# Patient Record
Sex: Male | Born: 1955 | Hispanic: No | Marital: Married | Smoking: Never smoker
Health system: Southern US, Community
[De-identification: ages and names within clinical notes are randomized; demographics above are authoritative.]

## PROBLEM LIST (undated history)

## (undated) DIAGNOSIS — K219 Gastro-esophageal reflux disease without esophagitis: Secondary | ICD-10-CM

## (undated) DIAGNOSIS — E785 Hyperlipidemia, unspecified: Secondary | ICD-10-CM

## (undated) DIAGNOSIS — I10 Essential (primary) hypertension: Secondary | ICD-10-CM

## (undated) DIAGNOSIS — C61 Malignant neoplasm of prostate: Secondary | ICD-10-CM

## (undated) DIAGNOSIS — Z8619 Personal history of other infectious and parasitic diseases: Secondary | ICD-10-CM

## (undated) HISTORY — DX: Essential (primary) hypertension: I10

## (undated) HISTORY — PX: EXTERNAL EAR SURGERY: SHX627

## (undated) HISTORY — DX: Hyperlipidemia, unspecified: E78.5

## (undated) HISTORY — PX: PROSTATE SURGERY: SHX751

## (undated) HISTORY — DX: Gastro-esophageal reflux disease without esophagitis: K21.9

## (undated) HISTORY — DX: Personal history of other infectious and parasitic diseases: Z86.19

---

## 2001-09-19 ENCOUNTER — Other Ambulatory Visit: Admission: RE | Admit: 2001-09-19 | Discharge: 2001-09-19 | Payer: Self-pay | Admitting: Otolaryngology

## 2002-01-09 ENCOUNTER — Encounter: Admission: RE | Admit: 2002-01-09 | Discharge: 2002-01-09 | Payer: Self-pay | Admitting: *Deleted

## 2002-01-09 ENCOUNTER — Encounter: Payer: Self-pay | Admitting: *Deleted

## 2007-10-22 LAB — HM COLONOSCOPY

## 2017-01-02 ENCOUNTER — Ambulatory Visit: Payer: 59

## 2017-01-02 ENCOUNTER — Ambulatory Visit (INDEPENDENT_AMBULATORY_CARE_PROVIDER_SITE_OTHER): Payer: 59

## 2017-01-02 ENCOUNTER — Ambulatory Visit (INDEPENDENT_AMBULATORY_CARE_PROVIDER_SITE_OTHER): Payer: 59 | Admitting: Podiatry

## 2017-01-02 ENCOUNTER — Encounter: Payer: Self-pay | Admitting: Podiatry

## 2017-01-02 VITALS — Resp 16 | Ht 69.5 in | Wt 240.0 lb

## 2017-01-02 DIAGNOSIS — M779 Enthesopathy, unspecified: Secondary | ICD-10-CM | POA: Diagnosis not present

## 2017-01-02 DIAGNOSIS — M79672 Pain in left foot: Secondary | ICD-10-CM

## 2017-01-02 DIAGNOSIS — L84 Corns and callosities: Secondary | ICD-10-CM

## 2017-01-02 DIAGNOSIS — M1000 Idiopathic gout, unspecified site: Secondary | ICD-10-CM

## 2017-01-02 DIAGNOSIS — M79673 Pain in unspecified foot: Secondary | ICD-10-CM

## 2017-01-02 DIAGNOSIS — M7751 Other enthesopathy of right foot: Secondary | ICD-10-CM | POA: Diagnosis not present

## 2017-01-02 DIAGNOSIS — M79674 Pain in right toe(s): Secondary | ICD-10-CM

## 2017-01-02 DIAGNOSIS — M778 Other enthesopathies, not elsewhere classified: Secondary | ICD-10-CM

## 2017-01-02 MED ORDER — TRIAMCINOLONE ACETONIDE 10 MG/ML IJ SUSP
10.0000 mg | Freq: Once | INTRAMUSCULAR | Status: AC
  Administered 2017-01-02: 10 mg

## 2017-01-02 NOTE — Patient Instructions (Signed)

## 2017-01-02 NOTE — Progress Notes (Signed)
cc

## 2017-01-02 NOTE — Progress Notes (Signed)
   Subjective:    Patient ID: TAJI HRNCIR, male    DOB: [birth year], 61 y.o.   MRN: [id number]  HPI  Chief Complaint  Patient presents with  . Skin Lesion    Left foot; plantar forefoot, beneath 5th toe x 3 months. Pt states that it is painful.  . Foot Pain    Left foot; lateral side, Pt states that he has a "knot in the area for a couple of years, pain worsens when he wears steel toe boots and on his feet". Also has a small knot on lateral side of right foot in the same area that is not painful.   . Toe Pain    Right foot, great toe x 2 weeks. Pt states that it comes and goes and he is concerned it is gout.        Review of Systems     Objective:   Physical Exam        Assessment & Plan:

## 2017-01-04 NOTE — Progress Notes (Signed)
Subjective:     Patient ID: Jason Bartlett, male   DOB: [birth year], 61 y.o.   MRN: [id number]  HPI patient states she's developed a lot of pain under the left foot with the metatarsal being quite inflamed and making it hard to walk. States that he's tried different options without relief of symptoms and that it's gradually becoming more intense   Review of Systems  All other systems reviewed and are negative.      Objective:   Physical Exam  Constitutional: He is oriented to person, place, and time.  Cardiovascular: Intact distal pulses.   Musculoskeletal: Normal range of motion.  Neurological: He is oriented to person, place, and time.  Skin: Skin is warm.  Nursing note and vitals reviewed.  neurovascular status intact muscle strength adequate range of motion within normal limits with patient found to have intense discomfort underneath the fifth metatarsal left with fluid buildup keratotic tissue formation and pain when palpated. Patient's noted to have good digital perfusion and is well oriented 3 Inflammatory    Assessment:      capsulitis fifth MPJ left with no indications of fracture with lesion formation with lucent core    Plan:     H&P condition reviewed with patient and explained. Today I did a careful capsular injection 3 mg Dexon some Kenalog 5 mg Xylocaine and debrided the lesion fully and advised on reduced activity. Reappoint when symptomatic   X-ray report was negative for signs of fracture or bone pathology

## 2017-10-28 DIAGNOSIS — Z23 Encounter for immunization: Secondary | ICD-10-CM | POA: Diagnosis not present

## 2018-02-08 ENCOUNTER — Ambulatory Visit (INDEPENDENT_AMBULATORY_CARE_PROVIDER_SITE_OTHER): Payer: 59 | Admitting: Family Medicine

## 2018-02-08 ENCOUNTER — Encounter: Payer: Self-pay | Admitting: Family Medicine

## 2018-02-08 ENCOUNTER — Other Ambulatory Visit: Payer: Self-pay

## 2018-02-08 VITALS — BP 150/88 | HR 80 | Temp 98.1°F | Resp 16 | Ht 70.0 in | Wt 259.2 lb

## 2018-02-08 DIAGNOSIS — Z1211 Encounter for screening for malignant neoplasm of colon: Secondary | ICD-10-CM | POA: Diagnosis not present

## 2018-02-08 DIAGNOSIS — N529 Male erectile dysfunction, unspecified: Secondary | ICD-10-CM

## 2018-02-08 DIAGNOSIS — Z23 Encounter for immunization: Secondary | ICD-10-CM | POA: Diagnosis not present

## 2018-02-08 DIAGNOSIS — K429 Umbilical hernia without obstruction or gangrene: Secondary | ICD-10-CM | POA: Diagnosis not present

## 2018-02-08 DIAGNOSIS — E785 Hyperlipidemia, unspecified: Secondary | ICD-10-CM | POA: Insufficient documentation

## 2018-02-08 DIAGNOSIS — I10 Essential (primary) hypertension: Secondary | ICD-10-CM

## 2018-02-08 MED ORDER — HYDROCHLOROTHIAZIDE 12.5 MG PO TABS
12.5000 mg | ORAL_TABLET | Freq: Every day | ORAL | 3 refills | Status: DC
Start: 2018-02-08 — End: 2018-03-28

## 2018-02-08 NOTE — Assessment & Plan Note (Signed)
Pt has hx of HTN and was previously on medication.  BP is again elevated today so will start HCTZ daily (has previously been on Benicar HCT 40/12.5mg  w/o difficulty despite sulfa allergy).  He is currently asymptomatic.  Check labs.  Will follow closely.

## 2018-02-08 NOTE — Assessment & Plan Note (Signed)
New to provider but pt has hx of this.  Previously on Pravastatin.  He has not had lipids checked in quite awhile.  Check labs.  Start meds prn

## 2018-02-08 NOTE — Assessment & Plan Note (Signed)
New to provider, ongoing for pt.  Hernia is large but not painful.  Will refer to Dr Emogene Morgan for evaluation and tx.  Pt expressed understanding and is in agreement w/ plan.

## 2018-02-08 NOTE — Assessment & Plan Note (Signed)
Pt's BMI is 37.2 but given his HTN and hyperlipidemia, he qualifies as morbidly obese.  Check labs to risk stratify.  Encouraged healthy diet and regular exercise.  Will follow.

## 2018-02-08 NOTE — Patient Instructions (Signed)
Follow up in 1 month to recheck BP We'll notify you of your lab results and make any changes if needed We will refer you to both General Surgery and [facility] Continue to work on healthy diet and regular exercise- you can do it!!! START the Hydrochlorothiazide daily to improve your blood pressure Limit your salt intake- this will also improve blood presure Call with any questions or concerns Welcome!  We're glad to have you!!!

## 2018-02-08 NOTE — Assessment & Plan Note (Signed)
New.  Pt is not interested in medication at this time.  Would like to attempt BP control and weight loss first.  Will revisit as needed.

## 2018-02-08 NOTE — Progress Notes (Signed)
   Subjective:    Patient ID: Jason Bartlett, male    DOB: 05-11-1956, 62 y.o.   MRN: [id number]  HPI New to establish.  Pt has not been seen in 10-12 yrs.  Due for repeat colonoscopy- last done 2018.  Hyperlipidemia- pt was previously on Pravastatin but not on anything currently.  No abd pain, N/V.  Elevated BP- pt reports he has a hx of elevated BP but was able to stop medication a few years ago.  No CP, SOB, HAs, visual changes, edema.  Umbilical hernia- not painful but enlarging.  Pt is very self conscious about this.  Obesity- pt's BMI is 37.2  No regular exercise.  Has an active job- 15,000 steps daily.  ED- still able to have an erection, 'it just doesn't last as long'.  Recall colonoscopy   Review of Systems For ROS see HPI     Objective:   Physical Exam  Constitutional: He is oriented to person, place, and time. He appears well-developed and well-nourished. No distress.  obese  HENT:  Head: Normocephalic and atraumatic.  Eyes: Conjunctivae and EOM are normal. Pupils are equal, round, and reactive to light.  Neck: Normal range of motion. Neck supple. No thyromegaly present.  Cardiovascular: Normal rate, regular rhythm, normal heart sounds and intact distal pulses.  No murmur heard. Pulmonary/Chest: Effort normal and breath sounds normal. No respiratory distress.  Abdominal: Soft. Bowel sounds are normal. He exhibits mass (large umbilical hernia). He exhibits no distension. There is no tenderness.  Musculoskeletal: He exhibits no edema.  Lymphadenopathy:    He has no cervical adenopathy.  Neurological: He is alert and oriented to person, place, and time. No cranial nerve deficit.  Skin: Skin is warm and dry.  Psychiatric: He has a normal mood and affect. His behavior is normal.  Vitals reviewed.         Assessment & Plan:

## 2018-02-09 LAB — HEPATIC FUNCTION PANEL
AG Ratio: 1.4 (calc) (ref 1.0–2.5)
ALT: 37 U/L (ref 9–46)
AST: 35 U/L (ref 10–35)
Albumin: 4.3 g/dL (ref 3.6–5.1)
Alkaline phosphatase (APISO): 49 U/L (ref 40–115)
Bilirubin, Direct: 0.1 mg/dL (ref 0.0–0.2)
Globulin: 3 g/dL (calc) (ref 1.9–3.7)
Indirect Bilirubin: 0.7 mg/dL (calc) (ref 0.2–1.2)
Total Bilirubin: 0.8 mg/dL (ref 0.2–1.2)
Total Protein: 7.3 g/dL (ref 6.1–8.1)

## 2018-02-09 LAB — BASIC METABOLIC PANEL
BUN: 16 mg/dL (ref 7–25)
CO2: 27 mmol/L (ref 20–32)
Calcium: 9.7 mg/dL (ref 8.6–10.3)
Chloride: 103 mmol/L (ref 98–110)
Creat: 1.1 mg/dL (ref 0.70–1.25)
Glucose, Bld: 103 mg/dL — ABNORMAL HIGH (ref 65–99)
Potassium: 4.7 mmol/L (ref 3.5–5.3)
Sodium: 142 mmol/L (ref 135–146)

## 2018-02-09 LAB — LIPID PANEL
Cholesterol: 176 mg/dL (ref <200)
HDL: 32 mg/dL — ABNORMAL LOW (ref >40)
LDL Cholesterol (Calc): 108 mg/dL (calc) — ABNORMAL HIGH
Non-HDL Cholesterol (Calc): 144 mg/dL (calc) — ABNORMAL HIGH (ref <130)
Total CHOL/HDL Ratio: 5.5 (calc) — ABNORMAL HIGH (ref <5.0)
Triglycerides: 237 mg/dL — ABNORMAL HIGH (ref <150)

## 2018-02-09 LAB — CBC WITH DIFFERENTIAL/PLATELET
Basophils Absolute: 122 cells/uL (ref 0–200)
Basophils Relative: 1.5 %
Eosinophils Absolute: 381 cells/uL (ref 15–500)
Eosinophils Relative: 4.7 %
HCT: 41.4 % (ref 38.5–50.0)
Hemoglobin: 14.6 g/dL (ref 13.2–17.1)
Lymphs Abs: 1790 cells/uL (ref 850–3900)
MCH: 30.4 pg (ref 27.0–33.0)
MCHC: 35.3 g/dL (ref 32.0–36.0)
MCV: 86.3 fL (ref 80.0–100.0)
MPV: 11.1 fL (ref 7.5–12.5)
Monocytes Relative: 9.4 %
Neutro Abs: 5046 cells/uL (ref 1500–7800)
Neutrophils Relative %: 62.3 %
Platelets: 157 10*3/uL (ref 140–400)
RBC: 4.8 10*6/uL (ref 4.20–5.80)
RDW: 12.9 % (ref 11.0–15.0)
Total Lymphocyte: 22.1 %
WBC mixed population: 761 cells/uL (ref 200–950)
WBC: 8.1 10*3/uL (ref 3.8–10.8)

## 2018-02-09 LAB — PSA: PSA: 2.9 ng/mL (ref <=4.0)

## 2018-02-09 LAB — TSH: TSH: 2.02 mIU/L (ref 0.40–4.50)

## 2018-02-28 DIAGNOSIS — K420 Umbilical hernia with obstruction, without gangrene: Secondary | ICD-10-CM | POA: Diagnosis not present

## 2018-03-07 ENCOUNTER — Other Ambulatory Visit: Payer: Self-pay

## 2018-03-07 ENCOUNTER — Ambulatory Visit: Payer: 59 | Admitting: Family Medicine

## 2018-03-07 ENCOUNTER — Encounter: Payer: Self-pay | Admitting: Family Medicine

## 2018-03-07 VITALS — BP 162/89 | HR 98 | Temp 98.1°F | Resp 16 | Ht 71.0 in | Wt 263.5 lb

## 2018-03-07 DIAGNOSIS — I10 Essential (primary) hypertension: Secondary | ICD-10-CM | POA: Diagnosis not present

## 2018-03-07 MED ORDER — LISINOPRIL-HYDROCHLOROTHIAZIDE 20-12.5 MG PO TABS: 1.0000 | ORAL_TABLET | Freq: Every day | ORAL | 3 refills | Status: DC

## 2018-03-07 NOTE — Progress Notes (Signed)
   Subjective:    Patient ID: Jason Bartlett, male    DOB: 06-Jul-1956, 62 y.o.   MRN: [id number]  HPI HTN- pt was started on HCTZ last visit.  BP today is 20 points higher despite medication.  Pt reports home BPs run 140s-150s.  No CP, SOB, HAs, visual changes, edema.  Morbid Obesity- pt has gained another 5 lbs.   Review of Systems For ROS see HPI     Objective:   Physical Exam  Constitutional: He is oriented to person, place, and time. He appears well-developed and well-nourished. No distress.  obese  HENT:  Head: Normocephalic and atraumatic.  Eyes: Conjunctivae and EOM are normal. Pupils are equal, round, and reactive to light.  Neck: Normal range of motion. Neck supple. No thyromegaly present.  Cardiovascular: Normal rate, regular rhythm, normal heart sounds and intact distal pulses.  No murmur heard. Pulmonary/Chest: Effort normal and breath sounds normal. No respiratory distress.  Abdominal: Soft. Bowel sounds are normal. He exhibits no distension.  Musculoskeletal: He exhibits no edema.  Lymphadenopathy:    He has no cervical adenopathy.  Neurological: He is alert and oriented to person, place, and time. No cranial nerve deficit.  Skin: Skin is warm and dry.  Psychiatric: He has a normal mood and affect. His behavior is normal.  Vitals reviewed.         Assessment & Plan:

## 2018-03-07 NOTE — Assessment & Plan Note (Signed)
Chronic problem.  Started on HCTZ last visit but BP is higher than last check.  Will add Lisinopril 20mg  daily.  Stressed need for healthy diet and regular exercise.  Check BMP to assess Cr and K+ since starting diuretic.  Reviewed supportive care and red flags that should prompt return.  Pt expressed understanding and is in agreement w/ plan.

## 2018-03-07 NOTE — Patient Instructions (Signed)
Follow up in 3-4 weeks to recheck BP We'll notify you of your lab results and make any changes if needed STOP the plain HCTZ START the combination Lisinopril HCTZ daily Continue to work on healthy diet and regular exercise- you can do it! Call with any questions or concerns Happy Spring!!!

## 2018-03-07 NOTE — Assessment & Plan Note (Signed)
Pt has gained 5 lbs since last visit.  Stressed need for healthy diet and regular exercise and discussed impact on BP.  Will follow.

## 2018-03-08 LAB — BASIC METABOLIC PANEL
BUN: 16 mg/dL (ref 7–25)
CO2: 29 mmol/L (ref 20–32)
Calcium: 9.1 mg/dL (ref 8.6–10.3)
Chloride: 102 mmol/L (ref 98–110)
Creat: 1.24 mg/dL (ref 0.70–1.25)
Glucose, Bld: 111 mg/dL — ABNORMAL HIGH (ref 65–99)
Potassium: 3.8 mmol/L (ref 3.5–5.3)
Sodium: 140 mmol/L (ref 135–146)

## 2018-03-08 LAB — EXTRA LAV TOP TUBE

## 2018-03-20 DIAGNOSIS — D126 Benign neoplasm of colon, unspecified: Secondary | ICD-10-CM | POA: Diagnosis not present

## 2018-03-20 DIAGNOSIS — Q438 Other specified congenital malformations of intestine: Secondary | ICD-10-CM | POA: Diagnosis not present

## 2018-03-20 DIAGNOSIS — Z1211 Encounter for screening for malignant neoplasm of colon: Secondary | ICD-10-CM | POA: Diagnosis not present

## 2018-03-28 ENCOUNTER — Other Ambulatory Visit: Payer: Self-pay

## 2018-03-28 ENCOUNTER — Encounter: Payer: Self-pay | Admitting: Family Medicine

## 2018-03-28 ENCOUNTER — Ambulatory Visit: Payer: 59 | Admitting: Family Medicine

## 2018-03-28 VITALS — BP 130/82 | HR 96 | Temp 98.1°F | Resp 18 | Ht 71.0 in | Wt 259.0 lb

## 2018-03-28 DIAGNOSIS — I10 Essential (primary) hypertension: Secondary | ICD-10-CM

## 2018-03-28 DIAGNOSIS — M62830 Muscle spasm of back: Secondary | ICD-10-CM

## 2018-03-28 MED ORDER — CYCLOBENZAPRINE HCL 10 MG PO TABS: 10.0000 mg | ORAL_TABLET | Freq: Three times a day (TID) | ORAL | 0 refills | Status: DC | PRN

## 2018-03-28 NOTE — Progress Notes (Signed)
   Subjective:    Patient ID: Jason Bartlett, male    DOB: 14-Aug-1956, 62 y.o.   MRN: [id number]  HPI HTN- chronic problem.  At last visit, pt was started on Lisinopril 20mg  in addition to his HCTZ.  BP is much better today.  He is also down 5 lbs.  Denies CP, SOB, HAs, visual changes, edema  Back spasm- pt reports sxs started 2-3 days ago.  Is able to bend over and 'pick up heavy stuff' but if he moves in 'a certain way' back will tighten up.  No radiation of pain.  Pt is able to sit, stand, lie w/o difficulty.  Spasm will last 'just a few seconds'.   Review of Systems For ROS see HPI     Objective:   Physical Exam  Constitutional: He is oriented to person, place, and time. He appears well-developed and well-nourished. No distress.  HENT:  Head: Normocephalic and atraumatic.  Eyes: Pupils are equal, round, and reactive to light. Conjunctivae and EOM are normal.  Neck: Normal range of motion. Neck supple. No thyromegaly present.  Cardiovascular: Normal rate, regular rhythm, normal heart sounds and intact distal pulses.  No murmur heard. Pulmonary/Chest: Effort normal and breath sounds normal. No respiratory distress.  Abdominal: Soft. Bowel sounds are normal. He exhibits no distension.  Musculoskeletal: He exhibits tenderness (mild TTP over bilateral paraspinal muscles which are obviously in spasm). He exhibits no edema.  Lymphadenopathy:    He has no cervical adenopathy.  Neurological: He is alert and oriented to person, place, and time. No cranial nerve deficit.  Skin: Skin is warm and dry.  Psychiatric: He has a normal mood and affect. His behavior is normal.  Vitals reviewed.         Assessment & Plan:  Back spasm- new.  No red flags on hx or PE.  Reviewed dx and supportive care- start flexeril prn, heating pad.  Reviewed red flags that should prompt return.  Pt expressed understanding and is in agreement w/ plan.

## 2018-03-28 NOTE — Patient Instructions (Addendum)
Schedule your complete physical in [facility] notify you of your lab results and make any changes if needed Take the Cyclobenzaprine nightly as needed for back pain/spasm- will cause drowsiness Heating pad for muscle spasm Call with any questions or concerns Happy Spring!!!

## 2018-03-28 NOTE — Assessment & Plan Note (Signed)
Much improved since adding Lisinopril.  Repeat BMP given the addition of the ACE.  Asymptomatic at this time.  No anticipated med changes.  Will follow.

## 2018-03-29 ENCOUNTER — Encounter: Payer: Self-pay | Admitting: General Practice

## 2018-03-29 LAB — BASIC METABOLIC PANEL
BUN: 21 mg/dL (ref 7–25)
CO2: 26 mmol/L (ref 20–32)
Calcium: 9.4 mg/dL (ref 8.6–10.3)
Chloride: 100 mmol/L (ref 98–110)
Creat: 1.18 mg/dL (ref 0.70–1.25)
Glucose, Bld: 114 mg/dL — ABNORMAL HIGH (ref 65–99)
Potassium: 4.7 mmol/L (ref 3.5–5.3)
Sodium: 138 mmol/L (ref 135–146)

## 2018-03-29 LAB — EXTRA LAV TOP TUBE

## 2018-03-29 LAB — SPECIMEN COMPROMISED

## 2018-03-30 ENCOUNTER — Ambulatory Visit: Payer: 59 | Admitting: Family Medicine

## 2018-04-20 DIAGNOSIS — K420 Umbilical hernia with obstruction, without gangrene: Secondary | ICD-10-CM | POA: Diagnosis not present

## 2018-06-26 ENCOUNTER — Other Ambulatory Visit: Payer: Self-pay | Admitting: Family Medicine

## 2018-07-18 ENCOUNTER — Encounter: Payer: Self-pay | Admitting: Physician Assistant

## 2018-07-18 ENCOUNTER — Ambulatory Visit: Payer: 59 | Admitting: Physician Assistant

## 2018-07-18 ENCOUNTER — Other Ambulatory Visit: Payer: Self-pay

## 2018-07-18 VITALS — BP 122/82 | HR 103 | Temp 99.2°F | Resp 16 | Ht 71.0 in | Wt 253.0 lb

## 2018-07-18 DIAGNOSIS — J208 Acute bronchitis due to other specified organisms: Secondary | ICD-10-CM

## 2018-07-18 DIAGNOSIS — B9689 Other specified bacterial agents as the cause of diseases classified elsewhere: Secondary | ICD-10-CM

## 2018-07-18 MED ORDER — DOXYCYCLINE HYCLATE 100 MG PO CAPS: 100.0000 mg | ORAL_CAPSULE | Freq: Two times a day (BID) | ORAL | 0 refills | Status: DC

## 2018-07-18 MED ORDER — PROMETHAZINE-DM 6.25-15 MG/5ML PO SYRP: 5.0000 mL | ORAL_SOLUTION | Freq: Four times a day (QID) | ORAL | 0 refills | Status: DC | PRN

## 2018-07-18 NOTE — Patient Instructions (Signed)
Take antibiotic (Doxycycline) as directed.  Increase fluids.  Get plenty of rest. Use Plain Mucinex for congestion. Use the prescription cough syrup as directed. Take a daily probiotic (I recommend Align or Culturelle, but even Activia Yogurt may be beneficial).  A humidifier placed in the bedroom may offer some relief for a dry, scratchy throat of nasal irritation.  Read information below on acute bronchitis. Please call or return to clinic if symptoms are not improving.  Acute Bronchitis Bronchitis is when the airways that extend from the windpipe into the lungs get red, puffy, and painful (inflamed). Bronchitis often causes thick spit (mucus) to develop. This leads to a cough. A cough is the most common symptom of bronchitis. In acute bronchitis, the condition usually begins suddenly and goes away over time (usually in 2 weeks). Smoking, allergies, and asthma can make bronchitis worse. Repeated episodes of bronchitis may cause more lung problems.  HOME CARE  Rest.  Drink enough fluids to keep your pee (urine) clear or pale yellow (unless you need to limit fluids as told by your doctor).  Only take over-the-counter or prescription medicines as told by your doctor.  Avoid smoking and secondhand smoke. These can make bronchitis worse. If you are a smoker, think about using nicotine gum or skin patches. Quitting smoking will help your lungs heal faster.  Reduce the chance of getting bronchitis again by:  Washing your hands often.  Avoiding people with cold symptoms.  Trying not to touch your hands to your mouth, nose, or eyes.  Follow up with your doctor as told.  GET HELP IF: Your symptoms do not improve after 1 week of treatment. Symptoms include:  Cough.  Fever.  Coughing up thick spit.  Body aches.  Chest congestion.  Chills.  Shortness of breath.  Sore throat.  GET HELP RIGHT AWAY IF:   You have an increased fever.  You have chills.  You have severe shortness of  breath.  You have bloody thick spit (sputum).  You throw up (vomit) often.  You lose too much body fluid (dehydration).  You have a severe headache.  You faint.  MAKE SURE YOU:   Understand these instructions.  Will watch your condition.  Will get help right away if you are not doing well or get worse. Document Released: 05/30/2008 Document Revised: 08/14/2013 Document Reviewed: 06/04/2013 [facility] Patient Information 2015 [place], [place]. This information is not intended to replace advice given to you by your health care provider. Make sure you discuss any questions you have with your health care provider.

## 2018-07-18 NOTE — Progress Notes (Signed)
Patient presents to clinic today c/o 2 weeks of rhinorrhea and nasal congestion changing last Friday to chest congestion and significant dry cough, affecting his sleep. Notes fever starting on Sunday with Tmax 101. Denies temperature above 100 since. Denies sinus pain, ear pain, tooth pain, chest pain, SOB or wheezing. Denies N/V/D, recent travel or sick contact. .   Past Medical History:  Diagnosis Date  . GERD (gastroesophageal reflux disease)   . History of chicken pox   . Hyperlipidemia   . Hypertension     Current Outpatient Medications on File Prior to Visit  Medication Sig Dispense Refill  . lisinopril-hydrochlorothiazide (PRINZIDE,ZESTORETIC) 20-12.5 MG tablet TAKE 1 TABLET BY MOUTH DAILY 30 tablet 2  . Multiple Vitamins-Minerals (MULTIVITAMIN ADULT) CHEW Chew by mouth.     No current facility-administered medications on file prior to visit.     Allergies  Allergen Reactions  . Penicillins Swelling  . Sulfa Antibiotics Swelling    Family History  Problem Relation Age of Onset  . Dementia Father   . Hearing loss Father   . Hearing loss Brother   . Early death Paternal Grandfather   . Heart attack Paternal Grandfather     Social History   Socioeconomic History  . Marital status: Married    Spouse name: Not on file  . Number of children: Not on file  . Years of education: Not on file  . Highest education level: Not on file  Occupational History  . Not on file  Social Needs  . Financial resource strain: Not on file  . Food insecurity:    Worry: Not on file    Inability: Not on file  . Transportation needs:    Medical: Not on file    Non-medical: Not on file  Tobacco Use  . Smoking status: Never Smoker  . Smokeless tobacco: Never Used  Substance and Sexual Activity  . Alcohol use: Yes  . Drug use: No  . Sexual activity: Yes  Lifestyle  . Physical activity:    Days per week: Not on file    Minutes per session: Not on file  . Stress: Not on file    Relationships  . Social connections:    Talks on phone: Not on file    Gets together: Not on file    Attends religious service: Not on file    Active member of club or organization: Not on file    Attends meetings of clubs or organizations: Not on file    Relationship status: Not on file  Other Topics Concern  . Not on file  Social History Narrative  . Not on file    Review of Systems - See HPI.  All other ROS are negative.  BP 122/82   Pulse (!) 103   Temp 99.2 F (37.3 C) (Oral)   Resp 16   Ht 5\' 11"  (1.803 m)   Wt 253 lb (114.8 kg)   SpO2 97%   BMI 35.29 kg/m   Physical Exam  Constitutional: He is oriented to person, place, and time. He appears well-developed and well-nourished.  HENT:  Head: Normocephalic and atraumatic.  Right Ear: External ear normal.  Left Ear: External ear normal.  Nose: Nose normal.  Mouth/Throat: Oropharynx is clear and moist.  Eyes: Conjunctivae are normal.  Neck: Neck supple.  Cardiovascular: Normal rate, regular rhythm, normal heart sounds and intact distal pulses.  Pulmonary/Chest: Effort normal and breath sounds normal. No stridor. No respiratory distress. He has no wheezes.  He has no rales. He exhibits no tenderness.  Lymphadenopathy:    He has no cervical adenopathy.  Neurological: He is alert and oriented to person, place, and time.  Psychiatric: He has a normal mood and affect.  Vitals reviewed.  Assessment/Plan: 1. Acute bacterial bronchitis Rx Doxycycline.  Increase fluids.  Rest.  Saline nasal spray.  Probiotic.  Mucinex as directed.  Humidifier in bedroom. Rx promethazine-dm.  Call or return to clinic if symptoms are not improving.  - doxycycline (VIBRAMYCIN) 100 MG capsule; Take 1 capsule (100 mg total) by mouth 2 (two) times daily.  Dispense: 14 capsule; Refill: 0 - promethazine-dextromethorphan (PROMETHAZINE-DM) 6.25-15 MG/5ML syrup; Take 5 mLs by mouth 4 (four) times daily as needed.  Dispense: 118 mL; Refill:  0   Leeanne Rio, PA-C

## 2018-08-21 ENCOUNTER — Telehealth: Payer: Self-pay | Admitting: Physician Assistant

## 2018-08-21 NOTE — Telephone Encounter (Signed)
That was over a month ago. He would need reassessment in office either with me or his PCP.

## 2018-08-21 NOTE — Telephone Encounter (Signed)
Copied from [place] [id number]. Topic: Inquiry >> Aug 21, 2018  2:08 PM Margot Ables wrote: Reason for CRM: pt is still having a nonproductive cough since being into the office 07/18/18 and seeing Elyn Aquas, [place] - pt coughs continuously. Would another round of ABX help? Please advise, ok to leave detailed msg.  [id number] - [place], [place] - [address] AT [place] [address] [phone] (Phone) [phone] (Fax)

## 2018-08-21 NOTE — Telephone Encounter (Signed)
Called patient and LMOVM to return call  [place] for [facility] to Discuss results / PCP / recommendations / Schedule patient  Called patient and left a voice message to let him know that it has been over a month since we have seen him and we need for him to call the office to make an appointment with Einar Pheasant or Dr. Birdie Riddle for reassessment of his cough.  Please schedule patient for an office visit.  CRM Created.

## 2018-09-03 ENCOUNTER — Other Ambulatory Visit: Payer: Self-pay

## 2018-09-03 ENCOUNTER — Encounter: Payer: Self-pay | Admitting: Physician Assistant

## 2018-09-03 ENCOUNTER — Ambulatory Visit: Payer: 59 | Admitting: Physician Assistant

## 2018-09-03 VITALS — BP 124/82 | HR 81 | Temp 98.2°F | Resp 16 | Ht 71.0 in | Wt 254.0 lb

## 2018-09-03 DIAGNOSIS — J209 Acute bronchitis, unspecified: Secondary | ICD-10-CM

## 2018-09-03 MED ORDER — AZITHROMYCIN 250 MG PO TABS: ORAL_TABLET | ORAL | 0 refills | Status: DC

## 2018-09-03 MED ORDER — IPRATROPIUM-ALBUTEROL 0.5-2.5 (3) MG/3ML IN SOLN
3.0000 mL | Freq: Once | RESPIRATORY_TRACT | Status: AC
  Administered 2018-09-03: 3 mL via RESPIRATORY_TRACT

## 2018-09-03 MED ORDER — PREDNISONE 20 MG PO TABS: 40.0000 mg | ORAL_TABLET | Freq: Every day | ORAL | 0 refills | Status: DC

## 2018-09-03 MED ORDER — HYDROCOD POLST-CPM POLST ER 10-8 MG/5ML PO SUER: 5.0000 mL | Freq: Two times a day (BID) | ORAL | 0 refills | Status: DC | PRN

## 2018-09-03 NOTE — Progress Notes (Signed)
Patient presents to clinic today c/o recurrent cough that is sometimes dry and at other times productive, associated with chest tightness and wheezing over the past several weeks.  Patient was seen at the end of July and diagnosed and treated for acute bacterial bronchitis.  Noticed symptoms got better on the antibiotic, but recurred a week or so after.  Denies fevers, chills or chest pain.  Denies sinus pressure or sinus pain.  Denies recent travel or sick contact..   Past Medical History:  Diagnosis Date  . GERD (gastroesophageal reflux disease)   . History of chicken pox   . Hyperlipidemia   . Hypertension     Current Outpatient Medications on File Prior to Visit  Medication Sig Dispense Refill  . lisinopril-hydrochlorothiazide (PRINZIDE,ZESTORETIC) 20-12.5 MG tablet TAKE 1 TABLET BY MOUTH DAILY 30 tablet 2  . Multiple Vitamins-Minerals (MULTIVITAMIN ADULT) CHEW Chew by mouth.     No current facility-administered medications on file prior to visit.     Allergies  Allergen Reactions  . Penicillins Swelling  . Sulfa Antibiotics Swelling    Family History  Problem Relation Age of Onset  . Dementia Father   . Hearing loss Father   . Hearing loss Brother   . Early death Paternal Grandfather   . Heart attack Paternal Grandfather     Social History   Socioeconomic History  . Marital status: Married    Spouse name: Not on file  . Number of children: Not on file  . Years of education: Not on file  . Highest education level: Not on file  Occupational History  . Not on file  Social Needs  . Financial resource strain: Not on file  . Food insecurity:    Worry: Not on file    Inability: Not on file  . Transportation needs:    Medical: Not on file    Non-medical: Not on file  Tobacco Use  . Smoking status: Never Smoker  . Smokeless tobacco: Never Used  Substance and Sexual Activity  . Alcohol use: Yes  . Drug use: No  . Sexual activity: Yes  Lifestyle  . Physical  activity:    Days per week: Not on file    Minutes per session: Not on file  . Stress: Not on file  Relationships  . Social connections:    Talks on phone: Not on file    Gets together: Not on file    Attends religious service: Not on file    Active member of club or organization: Not on file    Attends meetings of clubs or organizations: Not on file    Relationship status: Not on file  Other Topics Concern  . Not on file  Social History Narrative  . Not on file   Review of Systems - See HPI.  All other ROS are negative.  BP 124/82   Pulse 81   Temp 98.2 F (36.8 C) (Oral)   Resp 16   Ht 5\' 11"  (1.803 m)   Wt 254 lb (115.2 kg)   SpO2 99%   BMI 35.43 kg/m   Physical Exam  Constitutional: He is oriented to person, place, and time. He appears well-developed and well-nourished.  HENT:  Head: Normocephalic and atraumatic.  Right Ear: External ear normal.  Left Ear: External ear normal.  Nose: Nose normal.  Mouth/Throat: Oropharynx is clear and moist.  Eyes: Conjunctivae are normal.  Neck: Neck supple.  Cardiovascular: Normal rate, regular rhythm, normal heart sounds and intact  distal pulses.  Pulmonary/Chest: Effort normal. No stridor. No respiratory distress. He has wheezes. He has no rales. He exhibits no tenderness.  Lymphadenopathy:    He has no cervical adenopathy.  Neurological: He is alert and oriented to person, place, and time.  Psychiatric: He has a normal mood and affect.  Vitals reviewed.  Assessment/Plan: 1. Acute bronchitis with bronchospasm DuoNeb given in office with improvement in wheeze.  Question significant bronchospasm in conjunction with some remnant bacterial bronchitis.  Will start 5-day prednisone 40 mg burst along with Z-Pak.  Rx Tussionex to use for more severe cough.  OTC medications and supportive measures reviewed.  Discussed with patient that if cough continues we may need to consider his lisinopril as a potential culprit.  - azithromycin  (ZITHROMAX) 250 MG tablet; Take 2 tablets on Day 1. Then take 1 tablet daily  Dispense: 6 tablet; Refill: 0 - predniSONE (DELTASONE) 20 MG tablet; Take 2 tablets (40 mg total) by mouth daily with breakfast.  Dispense: 10 tablet; Refill: 0 - chlorpheniramine-HYDROcodone (TUSSIONEX PENNKINETIC ER) 10-8 MG/5ML SUER; Take 5 mLs by mouth every 12 (twelve) hours as needed.  Dispense: 140 mL; Refill: 0 - ipratropium-albuterol (DUONEB) 0.5-2.5 (3) MG/3ML nebulizer solution 3 mL   Leeanne Rio, PA-C

## 2018-09-03 NOTE — Patient Instructions (Signed)
Please keep well-hydrated and get plenty of rest. Take the steroid burst and antibiotic as directed. Use the cough medication if needed for nighttime or severe cough. Delsym OTC for daytime cough.   If not resolving, would recommend trial off of the lisinopril and chest x-ray.

## 2018-09-06 ENCOUNTER — Other Ambulatory Visit: Payer: Self-pay

## 2018-09-06 ENCOUNTER — Encounter: Payer: Self-pay | Admitting: Family Medicine

## 2018-09-06 ENCOUNTER — Ambulatory Visit (INDEPENDENT_AMBULATORY_CARE_PROVIDER_SITE_OTHER): Payer: 59 | Admitting: Family Medicine

## 2018-09-06 VITALS — BP 123/81 | HR 89 | Temp 98.1°F | Resp 16 | Ht 71.0 in | Wt 258.3 lb

## 2018-09-06 DIAGNOSIS — Z125 Encounter for screening for malignant neoplasm of prostate: Secondary | ICD-10-CM

## 2018-09-06 DIAGNOSIS — Z0000 Encounter for general adult medical examination without abnormal findings: Secondary | ICD-10-CM | POA: Diagnosis not present

## 2018-09-06 DIAGNOSIS — I10 Essential (primary) hypertension: Secondary | ICD-10-CM

## 2018-09-06 DIAGNOSIS — N529 Male erectile dysfunction, unspecified: Secondary | ICD-10-CM

## 2018-09-06 MED ORDER — SILDENAFIL CITRATE 100 MG PO TABS: 50.0000 mg | ORAL_TABLET | Freq: Every day | ORAL | 11 refills | Status: DC | PRN

## 2018-09-06 NOTE — Assessment & Plan Note (Signed)
Deteriorated.  Pt is not able to have intercourse at this time despite the desire to do so.  Based on this, will start Viagra and monitor for improvement.

## 2018-09-06 NOTE — Patient Instructions (Signed)
Follow up in 6 months to recheck BP and cholesterol Schedule a nurse visit for your flu shot once you are done w/ your prednisone We'll notify you of your lab results and make any changes if needed Continue to work on healthy diet and regular exercise- you can do it! Call with any questions or concerns Happy Fall!

## 2018-09-06 NOTE — Progress Notes (Signed)
   Subjective:    Patient ID: Jason Bartlett, male    DOB: 1956/07/02, 62 y.o.   MRN: [id number]  HPI CPE- UTD on colonoscopy, Tdap.  Will get flu shot at pharmacy.   Review of Systems Patient reports no vision/hearing changes, anorexia, fever ,adenopathy, persistant/recurrent hoarseness, swallowing issues, chest pain, palpitations, edema, persistant/recurrent cough, hemoptysis, dyspnea (rest,exertional, paroxysmal nocturnal), gastrointestinal  bleeding (melena, rectal bleeding), abdominal pain, excessive heart burn, GU symptoms (dysuria, hematuria, voiding/incontinence issues) syncope, focal weakness, memory loss, skin/hair/nail changes, depression, anxiety, abnormal bruising/bleeding, musculoskeletal symptoms/signs.   Mild numbness of feet  ED- deteriorated.  Pt reports he has desire and some form of erection but 'not enough for penetration'.  Is interested in trying medication to see if this is helpful.    Objective:   Physical Exam BP 123/81   Pulse 89   Temp 98.1 F (36.7 C) (Oral)   Resp 16   Ht 5\' 11"  (1.803 m)   Wt 258 lb 5 oz (117.2 kg)   SpO2 97%   BMI 36.03 kg/m   General Appearance:    Alert, cooperative, no distress, appears stated age  Head:    Normocephalic, without obvious abnormality, atraumatic  Eyes:    PERRL, conjunctiva/corneas clear, EOM's intact, fundi    benign, both eyes       Ears:    Normal TM's and external ear canals, both ears  Nose:   Nares normal, septum midline, mucosa normal, no drainage   or sinus tenderness  Throat:   Lips, mucosa, and tongue normal; teeth and gums normal  Neck:   Supple, symmetrical, trachea midline, no adenopathy;       thyroid:  No enlargement/tenderness/nodules  Back:     Symmetric, no curvature, ROM normal, no CVA tenderness  Lungs:     Clear to auscultation bilaterally, respirations unlabored  Chest wall:    No tenderness or deformity  Heart:    Regular rate and rhythm, S1 and S2 normal, no murmur, rub   or gallop    Abdomen:     Soft, non-tender, bowel sounds active all four quadrants,    no masses, no organomegaly  Genitalia:    Normal male without lesion, discharge or tenderness  Rectal:    Normal tone, normal prostate, no masses or tenderness  Extremities:   Extremities normal, atraumatic, no cyanosis or edema  Pulses:   2+ and symmetric all extremities  Skin:   Skin color, texture, turgor normal, no rashes or lesions  Lymph nodes:   Cervical, supraclavicular, and axillary nodes normal  Neurologic:   CNII-XII intact. Normal strength, sensation and reflexes      throughout          Assessment & Plan:

## 2018-09-06 NOTE — Assessment & Plan Note (Signed)
Chronic problem.  Well controlled today.  Asymptomatic.  Check labs.  No anticipated med changes.  Will follow. 

## 2018-09-06 NOTE — Assessment & Plan Note (Signed)
Pt's PE WNL w/ exception of obesity.  Plans to get flu shot at pharmacy.  UTD on colonoscopy.  Check labs.  Stressed need for healthy diet and regular exercise.  Anticipatory guidance provided.

## 2018-09-07 ENCOUNTER — Telehealth: Payer: Self-pay | Admitting: General Practice

## 2018-09-07 ENCOUNTER — Other Ambulatory Visit (INDEPENDENT_AMBULATORY_CARE_PROVIDER_SITE_OTHER): Payer: 59

## 2018-09-07 DIAGNOSIS — R7309 Other abnormal glucose: Secondary | ICD-10-CM | POA: Diagnosis not present

## 2018-09-07 LAB — BASIC METABOLIC PANEL
BUN: 21 mg/dL (ref 6–23)
CO2: 26 mEq/L (ref 19–32)
Calcium: 9.8 mg/dL (ref 8.4–10.5)
Chloride: 98 mEq/L (ref 96–112)
Creatinine, Ser: 1.16 mg/dL (ref 0.40–1.50)
GFR: 67.24 mL/min (ref >60.00)
Glucose, Bld: 113 mg/dL — ABNORMAL HIGH (ref 70–99)
Potassium: 4.8 mEq/L (ref 3.5–5.1)
Sodium: 137 mEq/L (ref 135–145)

## 2018-09-07 LAB — LIPID PANEL
Cholesterol: 176 mg/dL (ref 0–200)
HDL: 36 mg/dL — ABNORMAL LOW (ref >39.00)
LDL Cholesterol: 112 mg/dL — ABNORMAL HIGH (ref 0–99)
NonHDL: 140.05
Total CHOL/HDL Ratio: 5
Triglycerides: 142 mg/dL (ref 0.0–149.0)
VLDL: 28.4 mg/dL (ref 0.0–40.0)

## 2018-09-07 LAB — CBC WITH DIFFERENTIAL/PLATELET
Basophils Absolute: 0.1 10*3/uL (ref 0.0–0.1)
Basophils Relative: 1.2 % (ref 0.0–3.0)
Eosinophils Absolute: 0.1 10*3/uL (ref 0.0–0.7)
Eosinophils Relative: 0.9 % (ref 0.0–5.0)
HCT: 42.6 % (ref 39.0–52.0)
Hemoglobin: 14.8 g/dL (ref 13.0–17.0)
Lymphocytes Relative: 10.8 % — ABNORMAL LOW (ref 12.0–46.0)
Lymphs Abs: 1.1 10*3/uL (ref 0.7–4.0)
MCHC: 34.6 g/dL (ref 30.0–36.0)
MCV: 91.1 fl (ref 78.0–100.0)
Monocytes Absolute: 0.2 10*3/uL (ref 0.1–1.0)
Monocytes Relative: 1.8 % — ABNORMAL LOW (ref 3.0–12.0)
Neutro Abs: 8.6 10*3/uL — ABNORMAL HIGH (ref 1.4–7.7)
Neutrophils Relative %: 85.3 % — ABNORMAL HIGH (ref 43.0–77.0)
Platelets: 140 10*3/uL — ABNORMAL LOW (ref 150.0–400.0)
RBC: 4.68 Mil/uL (ref 4.22–5.81)
RDW: 13.9 % (ref 11.5–15.5)
WBC: 10 10*3/uL (ref 4.0–10.5)

## 2018-09-07 LAB — HEPATIC FUNCTION PANEL
ALT: 21 U/L (ref 0–53)
AST: 17 U/L (ref 0–37)
Albumin: 4.6 g/dL (ref 3.5–5.2)
Alkaline Phosphatase: 44 U/L (ref 39–117)
Bilirubin, Direct: 0.1 mg/dL (ref 0.0–0.3)
Total Bilirubin: 0.8 mg/dL (ref 0.2–1.2)
Total Protein: 7.8 g/dL (ref 6.0–8.3)

## 2018-09-07 LAB — TSH: TSH: 1.23 u[IU]/mL (ref 0.35–4.50)

## 2018-09-07 LAB — PSA: PSA: 3.09 ng/mL (ref 0.10–4.00)

## 2018-09-10 LAB — HEMOGLOBIN A1C: Hgb A1c MFr Bld: 6.2 % (ref 4.6–6.5)

## 2018-09-10 NOTE — Telephone Encounter (Signed)
Error

## 2018-09-12 ENCOUNTER — Other Ambulatory Visit: Payer: Self-pay

## 2018-09-12 ENCOUNTER — Emergency Department (HOSPITAL_COMMUNITY): Payer: 59

## 2018-09-12 ENCOUNTER — Emergency Department (HOSPITAL_COMMUNITY)
Admission: EM | Admit: 2018-09-12 | Discharge: 2018-09-12 | Disposition: A | Discharge status: Home / Self Care | Payer: 59 | Attending: Emergency Medicine | Admitting: Emergency Medicine

## 2018-09-12 ENCOUNTER — Encounter (HOSPITAL_COMMUNITY): Payer: Self-pay

## 2018-09-12 DIAGNOSIS — R112 Nausea with vomiting, unspecified: Secondary | ICD-10-CM | POA: Diagnosis not present

## 2018-09-12 DIAGNOSIS — R110 Nausea: Secondary | ICD-10-CM | POA: Diagnosis not present

## 2018-09-12 DIAGNOSIS — R42 Dizziness and giddiness: Secondary | ICD-10-CM | POA: Insufficient documentation

## 2018-09-12 DIAGNOSIS — R55 Syncope and collapse: Secondary | ICD-10-CM | POA: Diagnosis not present

## 2018-09-12 DIAGNOSIS — I10 Essential (primary) hypertension: Secondary | ICD-10-CM | POA: Diagnosis not present

## 2018-09-12 DIAGNOSIS — Z79899 Other long term (current) drug therapy: Secondary | ICD-10-CM | POA: Insufficient documentation

## 2018-09-12 DIAGNOSIS — R61 Generalized hyperhidrosis: Secondary | ICD-10-CM | POA: Diagnosis not present

## 2018-09-12 DIAGNOSIS — R0902 Hypoxemia: Secondary | ICD-10-CM | POA: Diagnosis not present

## 2018-09-12 DIAGNOSIS — J80 Acute respiratory distress syndrome: Secondary | ICD-10-CM | POA: Diagnosis not present

## 2018-09-12 DIAGNOSIS — R1111 Vomiting without nausea: Secondary | ICD-10-CM | POA: Diagnosis not present

## 2018-09-12 LAB — CBC
HCT: 43.1 % (ref 39.0–52.0)
Hemoglobin: 14.2 g/dL (ref 13.0–17.0)
MCH: 30.7 pg (ref 26.0–34.0)
MCHC: 32.9 g/dL (ref 30.0–36.0)
MCV: 93.1 fL (ref 78.0–100.0)
Platelets: 185 10*3/uL (ref 150–400)
RBC: 4.63 MIL/uL (ref 4.22–5.81)
RDW: 13.3 % (ref 11.5–15.5)
WBC: 10.2 10*3/uL (ref 4.0–10.5)

## 2018-09-12 LAB — D-DIMER, QUANTITATIVE: D-Dimer, Quant: 2.59 ug/mL-FEU — ABNORMAL HIGH (ref 0.00–0.50)

## 2018-09-12 LAB — BASIC METABOLIC PANEL
Anion gap: 9 (ref 5–15)
BUN: 18 mg/dL (ref 8–23)
CO2: 28 mmol/L (ref 22–32)
Calcium: 9 mg/dL (ref 8.9–10.3)
Chloride: 98 mmol/L (ref 98–111)
Creatinine, Ser: 0.94 mg/dL (ref 0.61–1.24)
GFR calc Af Amer: >60 mL/min (ref >60)
GFR calc non Af Amer: >60 mL/min (ref >60)
Glucose, Bld: 135 mg/dL — ABNORMAL HIGH (ref 70–99)
Potassium: 4.5 mmol/L (ref 3.5–5.1)
Sodium: 135 mmol/L (ref 135–145)

## 2018-09-12 LAB — I-STAT TROPONIN, ED
Troponin i, poc: 0 ng/mL (ref 0.00–0.08)
Troponin i, poc: 0 ng/mL (ref 0.00–0.08)

## 2018-09-12 MED ORDER — ASPIRIN 81 MG PO CHEW: 81.0000 mg | CHEWABLE_TABLET | Freq: Every day | ORAL | 1 refills | Status: AC

## 2018-09-12 MED ORDER — IOPAMIDOL (ISOVUE-370) INJECTION 76%
INTRAVENOUS | Status: AC
  Filled 2018-09-12: qty 100

## 2018-09-12 MED ORDER — IOPAMIDOL (ISOVUE-370) INJECTION 76%
100.0000 mL | Freq: Once | INTRAVENOUS | Status: AC | PRN
  Administered 2018-09-12: 100 mL via INTRAVENOUS

## 2018-09-12 NOTE — ED Triage Notes (Addendum)
Pt BIBA from work. Pt was feeling lightheaded before work. Pt vomited 1x today, but has not had much to eat. Upon EMS arrival, pt's symptoms started to be relieved. Orthostats negative with EMS. Pt denies lightheadedness now.

## 2018-09-12 NOTE — ED Notes (Signed)
Pt is alert and oriented x 4 and is verbally responsive. Pt reports that he has been having some lightheadedness since this AM and was sent from work to [employer]. Pt does report having a similar symptom and was placed on  Prednisone and ABX lst week for treatment of Bronchitis and states that the steroid made a similar effects. Pt reports last dose on  this past Sat. Pt also reports associated nausea and one episode of vomiting today

## 2018-09-12 NOTE — ED Provider Notes (Signed)
Physical Exam  BP (!) 143/98   Pulse 67   Temp 98.1 F (36.7 C) (Oral)   Resp 16   Ht 5\' 11"  (1.803 m)   Wt 117 kg   SpO2 94%   BMI 35.98 kg/m   Assumed care from [facility] PA-C at 1600. Briefly, the patient is a 62 y.o. male with PMHx of  has a past medical history of GERD (gastroesophageal reflux disease), History of chicken pox, Hyperlipidemia, and Hypertension. here with dizziness, nausea, and diaphoresis for approximately 3 hours.  Patient is a symptomatic by the time he arrived in the emergency department.  Patient is reporting that he never had chest pain or shortness of breath, but felt that "something was not quite right".  Patient denies any personal history of CAD, family history of early MI, personal history DVT/PE, hormone use, recent immobilization, hospitalization, cancer treatment, recent surgery.  Labs Reviewed  BASIC METABOLIC PANEL - Abnormal; Notable for the following components:      Result Value   Glucose, Bld 135 (*)    All other components within normal limits  D-DIMER, QUANTITATIVE (NOT AT [facility]) - Abnormal; Notable for the following components:   D-Dimer, Quant 2.59 (*)    All other components within normal limits  CBC  I-STAT TROPONIN, ED  I-STAT TROPONIN, ED    Course of Care:   Physical Exam  Constitutional: He appears well-developed and well-nourished. No distress.  Sitting comfortably in bed.  HENT:  Head: Normocephalic and atraumatic.  Eyes: Conjunctivae are normal. Right eye exhibits no discharge. Left eye exhibits no discharge.  EOMs normal to gross examination.  Neck: Normal range of motion.  Cardiovascular: Normal rate and regular rhythm.  Pulses:      Radial pulses are 2+ on the right side, and 2+ on the left side.       Dorsalis pedis pulses are 2+ on the right side, and 2+ on the left side.  Intact, 2+ radial pulse.  Pulmonary/Chest: Effort normal and breath sounds normal. He has no wheezes. He has no rales.  Normal  respiratory effort. Patient converses comfortably. No audible wheeze or stridor.  Abdominal: He exhibits no distension.  Musculoskeletal: Normal range of motion.  Neurological: He is alert.  Cranial nerves intact to gross observation. Patient moves extremities without difficulty.  Skin: Skin is warm and dry. He is not diaphoretic.  Psychiatric: He has a normal mood and affect. His behavior is normal. Judgment and thought content normal.  Nursing note and vitals reviewed.   ED Course/Procedures   Clinical Course as of Sep 12 2029  Wed Sep 12, 2018  1418 EKG showed RBBB. No ST elevation/depression or acute changes to suggest ischemia/infarct.   [GM]  1503 Negative troponin. Remaining labs unremarkable.   [GM]  1505 Case discussed with Dr. Charlesetta Shanks. Pt's history suspicious for acute cardiac event. Will order repeat troponin in 2 hours and re-evaluate need for admission.   [GM]  8756 CXR normal.   [GM]  4332 Dr. Johnney Killian recommended that cardiology be consulted for their opinion and proceed with their recommendations from there.   [GM]  1556 Case discussed with Langston Masker, PA-C at shift change.   [GM]  1715 Cardiology paged x2.  Concerning anginal equivalent story.  May be in error, and discussed with nurse, but cannot be confirmed in error.  Will order d-dimer to rule out Pe. Discussed with Dr. Quintella Reichert who is in agreement with plan.   [AM]  1733 Discussed  case with Dr. Cruzita Lederer of [facility] who recommends OP follow up with PCP. Appreciate his involvement in the care of this patient.    [AM]  1749 Discussed case with Dr. Angelena Form of cardiology, who recommends outpatient follow-up and will contact the clinic for close outpatient follow-up with patient.  I appreciate his involvement in the care of this patient.  He agrees with plan, with negative delta troponin as long as d-dimer is negative.   [AM]    Clinical Course User Index [AM] Albesa Seen, PA-C [GM] [place],  Jonelle Sports, [place]    Procedures  MDM   Patient nontoxic-appearing, afebrile, no acute distress on my evaluation.  Differential diagnosis includes anginal equivalent, arrhythmia, pulmonary embolism.  Patient has no neurologic complaints, therefore doubt TIA it is possible for patient's symptoms.  Patient with negative delta troponin, elevated d-dimer, therefore received CTPA.  Patient has no active chest pain, equal pulses in all extremities, therefore doubt thoracic aortic dissection as cause of patient's elevated d-dimer.  Additionally, patient has no lower extremity edema or calf tenderness, therefore doubt DVT.  8:26 PM Reviewed discharge instructions with patient. Patient with no complaints and is comfortable at this time.  Return precautions given for any new or worsening symptoms.  Patient to follow-up with cardiology.  Patient is in understanding and agrees with the plan of care.   Albesa Seen, PA-C 09/12/18 2030    Charlesetta Shanks, MD 09/13/18 [phone]

## 2018-09-12 NOTE — ED Provider Notes (Addendum)
[facility] DEPT Provider Note  CSN: [id number] Arrival date & time: 09/12/18  1224  History   Chief Complaint Chief Complaint  Patient presents with  . Dizziness    HPI Jason Bartlett is a 62 y.o. male with a medical history of HTN, HLD and GERD who presented to the ED for lightheadedness. He states that this gradually began at 7am this morning and he also had associated symptoms of nausea, vomiting x1, diaphoresis and fatigue. This episode lasted ~4-5 hours and he did not receive any medication intervention prior to arrival. He denies chest pain, abdominal pain, SOB, palpitations or other pain. Patient reports that he was recently treated for bronchitis and feels those symptoms are resolving. Additional history obtained by medical chart. No previous cardiac history or complaints.  Past Medical History:  Diagnosis Date  . GERD (gastroesophageal reflux disease)   . History of chicken pox   . Hyperlipidemia   . Hypertension     Patient Active Problem List   Diagnosis Date Noted  . Physical exam 09/06/2018  . Umbilical hernia 47/08/6282  . Hyperlipidemia 02/08/2018  . HTN (hypertension) 02/08/2018  . Morbid obesity ([place]) 02/08/2018  . Erectile dysfunction 02/08/2018    History reviewed. No pertinent surgical history.    Home Medications    Prior to Admission medications   Medication Sig Start Date End Date Taking? Authorizing Provider  acetaminophen (TYLENOL) 500 MG tablet Take 1,000 mg by mouth every 6 (six) hours as needed for moderate pain.   Yes [provider]  lisinopril-hydrochlorothiazide (PRINZIDE,ZESTORETIC) 20-12.5 MG tablet TAKE 1 TABLET BY MOUTH DAILY 06/26/18  Yes Midge Minium, MD  Multiple Vitamins-Minerals (MULTIVITAMIN ADULT) CHEW Chew 1 tablet by mouth daily.    Yes [provider]  azithromycin (ZITHROMAX) 250 MG tablet Take 2 tablets on Day 1. Then take 1 tablet daily Patient not taking: Reported on  09/12/2018 09/03/18   Brunetta Jeans, PA-C  chlorpheniramine-HYDROcodone [facility]) 10-8 MG/5ML SUER Take 5 mLs by mouth every 12 (twelve) hours as needed. Patient not taking: Reported on 09/12/2018 09/03/18   Brunetta Jeans, PA-C  predniSONE (DELTASONE) 20 MG tablet Take 2 tablets (40 mg total) by mouth daily with breakfast. Patient not taking: Reported on 09/12/2018 09/03/18   Brunetta Jeans, PA-C  sildenafil (VIAGRA) 100 MG tablet Take 0.5-1 tablets (50-100 mg total) by mouth daily as needed for erectile dysfunction. 09/06/18   Midge Minium, MD    Family History Family History  Problem Relation Age of Onset  . Dementia Father   . Hearing loss Father   . Hearing loss Brother   . Early death Paternal Grandfather   . Heart attack Paternal Grandfather     Social History Social History   Tobacco Use  . Smoking status: Never Smoker  . Smokeless tobacco: Never Used  Substance Use Topics  . Alcohol use: Yes  . Drug use: No     Allergies   Penicillins and Sulfa antibiotics   Review of Systems Review of Systems  Constitutional: Positive for diaphoresis and fatigue. Negative for chills and fever.  HENT: Negative.   Eyes: Negative.   Respiratory: Negative for cough, choking, chest tightness, shortness of breath and wheezing.   Cardiovascular: Negative for chest pain, palpitations and leg swelling.  Gastrointestinal: Positive for nausea and vomiting. Negative for abdominal pain.  Genitourinary: Negative.   Musculoskeletal: Negative.   Skin: Negative.   Neurological: Positive for dizziness and light-headedness. Negative for  syncope, weakness and numbness.  Hematological: Negative.    Physical Exam Updated Vital Signs BP 130/89   Pulse 75   Temp 98.1 F (36.7 C) (Oral)   Resp 14   Ht 5\' 11"  (1.803 m)   Wt 117 kg   SpO2 90%   BMI 35.98 kg/m   Physical Exam  Constitutional: He is oriented to person, place, and time. He appears well-developed and  well-nourished. No distress.  Eyes: Pupils are equal, round, and reactive to light. Conjunctivae, EOM and lids are normal.  Neck: Full passive range of motion without pain. Neck supple. Normal carotid pulses present. No spinous process tenderness and no muscular tenderness present. Carotid bruit is not present. Normal range of motion present.  Cardiovascular: Normal rate, regular rhythm, normal heart sounds, intact distal pulses and normal pulses. Exam reveals no friction rub.  No murmur heard. Pulmonary/Chest: Effort normal and breath sounds normal.  Abdominal: Soft. Normal appearance and bowel sounds are normal. He exhibits no distension. There is no tenderness.  Musculoskeletal: Normal range of motion.  Neurological: He is alert and oriented to person, place, and time. He has normal strength. No cranial nerve deficit or sensory deficit. He exhibits normal muscle tone.  Reflex Scores:      Tricep reflexes are 2+ on the right side and 2+ on the left side.      Bicep reflexes are 2+ on the right side and 2+ on the left side.      Brachioradialis reflexes are 2+ on the right side and 2+ on the left side.      Patellar reflexes are 2+ on the right side and 2+ on the left side.      Achilles reflexes are 2+ on the right side and 2+ on the left side. Nursing note and vitals reviewed.  ED Treatments / Results  Labs (all labs ordered are listed, but only abnormal results are displayed) Labs Reviewed  BASIC METABOLIC PANEL - Abnormal; Notable for the following components:      Result Value   Glucose, Bld 135 (*)    All other components within normal limits  CBC  I-STAT TROPONIN, ED    EKG EKG Interpretation  Date/Time:  Wednesday September 12 2018 14:07:45 EDT Ventricular Rate:  77 PR Interval:    QRS Duration: 157 QT Interval:  408 QTC Calculation: 462 R Axis:     Text Interpretation:  Sinus rhythm Right bundle branch block agree. no STEMI. noold comparison Confirmed by Charlesetta Shanks [phone]) on 09/12/2018 2:16:05 PM   Radiology Dg Chest 2 View  Result Date: 09/12/2018 CLINICAL DATA:  Vomiting and lightheadedness. History of hypertension. EXAM: CHEST - 2 VIEW COMPARISON:  None. FINDINGS: The heart size and mediastinal contours are normal. There is mild aortic atherosclerosis. The lungs are clear. There is no pleural effusion or pneumothorax. No acute osseous findings are seen. There are mild degenerative changes within the spine. Telemetry leads overlie the chest. IMPRESSION: No active cardiopulmonary process. Electronically Signed   By: Richardean Sale M.D.   On: 09/12/2018 15:08    Procedures Procedures (including critical care time)  Medications Ordered in ED Medications - No data to display   Initial Impression / Assessment and Plan / ED Course  Triage vital signs and the nursing notes have been reviewed.  Pertinent labs & imaging results that were available during care of the patient were reviewed and considered in medical decision making (see chart for details).  Patient presents in no acute  distress and is resting comfortably on the bed. He reports that all his symptoms have resolved. Denies chest pain, SOB, dizziness, lightheadedness. Patient's history is suspicious for an acute cardiac event. Will proceed with typical chest pain work-up for further evaluation.  Clinical Course as of Sep 13 905  Wed Sep 12, 2018  1418 EKG showed RBBB. No ST elevation/depression or acute changes to suggest ischemia/infarct.   [GM]  1503 Negative troponin. Remaining labs unremarkable.   [GM]  1505 Case discussed with Dr. Charlesetta Shanks. Pt's history suspicious for acute cardiac event. Will order repeat troponin in 2 hours and re-evaluate need for admission.   [GM]  9892 CXR normal.   [GM]  1194 Dr. Johnney Killian recommended that cardiology be consulted for their opinion and proceed with their recommendations from there.   [GM]  1556 Case discussed with Langston Masker, PA-C  at shift change.   [GM]  1715 Cardiology paged x2.  Concerning anginal equivalent story.  May be in error, and discussed with nurse, but cannot be confirmed in error.  Will order d-dimer to rule out Pe. Discussed with Dr. Quintella Reichert who is in agreement with plan.   [AM]  1733 Discussed case with Dr. Cruzita Lederer of [facility] who recommends OP follow up with PCP. Appreciate his involvement in the care of this patient.    [AM]  1749 Discussed case with Dr. Angelena Form of cardiology, who recommends outpatient follow-up and will contact the clinic for close outpatient follow-up with patient.  I appreciate his involvement in the care of this patient.  He agrees with plan, with negative delta troponin as long as d-dimer is negative.   [AM]    Clinical Course User Index [AM] Albesa Seen, PA-C [GM] Romie Jumper, [place]   Final Clinical Impressions(s) / ED Diagnoses   Dispo: Case handed off to [facility], PA-C at shift change.  Final diagnoses:  None    ED Discharge Orders    None        Romie Jumper, PA-C 09/12/18 1557    Lindia Garms, [place], PA-C 09/13/18 1740    Charlesetta Shanks, MD 09/13/18 [phone]

## 2018-09-12 NOTE — ED Provider Notes (Signed)
Medical screening examination/treatment/procedure(s) were conducted as a shared visit with non-physician practitioner(s) and myself.  I personally evaluated the patient during the encounter.  EKG Interpretation  Date/Time:  Wednesday September 12 2018 14:07:45 EDT Ventricular Rate:  77 PR Interval:    QRS Duration: 157 QT Interval:  408 QTC Calculation: 462 R Axis:     Text Interpretation:  Sinus rhythm Right bundle branch block agree. no STEMI. noold comparison Confirmed by Charlesetta Shanks [phone]) on 09/12/2018 2:16:05 PM Patient got to work today and felt normal.  After about an hour at work he started to feel generally unwell.  He had some lightheadedness and general weakness.  He thought maybe he needed something to eat and try to snack.  Shortly after that he vomited.  He continued to feel weak and lightheaded.  Ports he also had an episode of sweating.  Patient denies any specific chest pain or shortness of breath.  He was evaluated at the nurses station referred to the emergency department.   Patient is alert and appropriate.  No respiratory distress.  Murmur gallop.  Lungs with occasional expiratory wheeze but good airflow.  Abdomen soft nontender.  No peripheral edema or calf tenderness.  Patient is alert and appropriate with coordinated movements.  Nonfocal neurologic exam.   Patient has right bundle branch block.  No old EKGs for comparison.  First cardiac enzymes negative.  Patient does not have any active chest pain.  Plan will be for repeat troponin and cardiology consultation for possible anginal equivalent with no old EKG comparisons.   Charlesetta Shanks, MD 09/12/18 [phone]

## 2018-09-12 NOTE — Discharge Instructions (Addendum)
Please read and follow all provided instructions.  Your diagnoses today include:  1. Lightheadedness   2. Nausea   3. Elevated blood pressure reading in office with diagnosis of hypertension     Tests performed today include: An EKG of your heart A chest x-ray Cardiac enzymes - a blood test for heart muscle damage Blood counts and electrolytes Vital signs. See below for your results today.  CT scan of the chest looking for any pulmonary embolus.  Medications prescribed:   Take any prescribed medications only as directed.  Follow-up instructions: Please follow-up with your primary care provider as soon as you can for further evaluation of your symptoms.   Return instructions:  SEEK IMMEDIATE MEDICAL ATTENTION IF: You have severe chest pain, especially if the pain is crushing or pressure-like and spreads to the arms, back, neck, or jaw, or if you have sweating, nausea (feeling sick to your stomach), or shortness of breath. THIS IS AN EMERGENCY. Don't wait to see if the pain will go away. Get medical help at once. Call 911 or 0 (operator). DO NOT drive yourself to the hospital.  Your chest pain gets worse and does not go away with rest.  You have an attack of chest pain lasting longer than usual, despite rest and treatment with the medications your caregiver has prescribed.  You wake from sleep with chest pain or shortness of breath. You feel dizzy or faint. You have chest pain not typical of your usual pain for which you originally saw your caregiver.  You have any other emergent concerns regarding your health.  Additional Information:  Your vital signs today were: BP 137/81 (BP Location: Right Arm)    Pulse 85    Temp 98.4 F (36.9 C) (Oral)    Resp 18    Ht 5\' 11"  (1.803 m)    Wt 117 kg    SpO2 96%    BMI 35.98 kg/m  If your blood pressure (BP) was elevated above 135/85 this visit, please have this repeated by your doctor within one month. --------------

## 2018-09-14 ENCOUNTER — Ambulatory Visit: Payer: 59 | Admitting: Cardiology

## 2018-09-14 ENCOUNTER — Encounter: Payer: Self-pay | Admitting: Cardiology

## 2018-09-14 VITALS — BP 110/84 | HR 100 | Ht 70.0 in | Wt 256.8 lb

## 2018-09-14 DIAGNOSIS — I208 Other forms of angina pectoris: Secondary | ICD-10-CM | POA: Diagnosis not present

## 2018-09-14 DIAGNOSIS — E782 Mixed hyperlipidemia: Secondary | ICD-10-CM | POA: Diagnosis not present

## 2018-09-14 DIAGNOSIS — I2089 Other forms of angina pectoris: Secondary | ICD-10-CM | POA: Insufficient documentation

## 2018-09-14 DIAGNOSIS — I10 Essential (primary) hypertension: Secondary | ICD-10-CM | POA: Diagnosis not present

## 2018-09-14 DIAGNOSIS — I4510 Unspecified right bundle-branch block: Secondary | ICD-10-CM

## 2018-09-14 NOTE — Progress Notes (Signed)
PCP: Midge Minium, MD  Clinic Note: Chief Complaint  Patient presents with  . Hospitalization Follow-up    ER visit for "not feeling well", dizzy, weak, N/V --> EDP concerned about Heart Attack    HPI: [name] is a 62 y.o. male with a PMH below who presents today for ER f/u for lightheadedness.  Jason Bartlett was last seen on September 12 by his PCP.  He had a very routine physical exam and was doing fine at that time.  Recent Hospitalizations:   ER visit September 18: Presented with symptoms of general illness, dizziness and nausea with vomiting and diaphoresis.  He had a quivering sensation in his chest for short period time, but otherwise no real chest pain or dyspnea.  Just knew something was "not" EKG noted right bundle branch block.  He ruled out for MI.  ER doctor was concerned and therefore referred to cardiology.  Studies Personally Reviewed - (if available, images/films reviewed: From Epic Chart or Care Everywhere)  None  Interval History: [name] presents here today for ER follow-up.  He tells me that on the day he went to the emergency room, he woke up feeling fine had not been having any issues besides the fact that about a week before he was on antibiotics for bronchitis.  He was pretty much getting over the bronchitis and was doing fine when he woke up in the morning.  He had a class in the morning and he started feeling just "off "like something was "not right".  He felt lightheaded and dizzy.  He got very nauseated at about 11 in the morning he went through up.  That resolved the nausea, but he still felt weak.  He drank some diet soda and had some crackers this actually made him more nauseated.  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  They saw how he looked noting diaphoresis and a tachycardic thready pulse.  There is concern for possible ACS and called EMS.  By time he got to the emergency room is  about 45 minutes later and he was feeling somewhat better.  He did not noticed any chest tightness or pressure but he did notice a weird sensation for short period time before his vomiting episode.  He did not notice any heart racing or palpitations.  He did not feel as though he would pass out but just felt lightheaded. Since this episode he has been doing fine but he is very fearful of trying to get into an exercise program. Leading up to this episode and following the episode he has not had any chest pain or pressure with rest or exertion.  No heart failure symptoms of PND, orthopnea or edema.  He has not had any rapid irregular heartbeats palpitations or syncope/near syncope.  No melena, hematochezia, hematuria, or epstaxis. No claudication.  ROS: A comprehensive was performed.  All pertinent symptoms noted in HPI. Review of Systems  Constitutional: Positive for malaise/fatigue (Only felt that way on September 18). Negative for chills and fever.  HENT: Negative for congestion.   Respiratory: Negative for cough, shortness of breath and wheezing.        Is pretty much over his bronchitis  Gastrointestinal: Positive for abdominal pain (For short period of time prior to vomiting on 9/18). Negative for heartburn.  Musculoskeletal: Negative for joint pain.  Psychiatric/Behavioral: The patient is nervous/anxious.   All other systems reviewed and  are negative.    I have reviewed and (if needed) personally updated the patient's problem list, medications, allergies, past medical and surgical history, social and family history.   Past Medical History:  Diagnosis Date  . GERD (gastroesophageal reflux disease)   . History of chicken pox   . Hyperlipidemia   . Hypertension     History reviewed. No pertinent surgical history.  Current Meds  Medication Sig  . acetaminophen (TYLENOL) 500 MG tablet Take 1,000 mg by mouth every 6 (six) hours as needed for moderate pain.  Marland Kitchen aspirin 81 MG  chewable tablet Chew 1 tablet (81 mg total) by mouth daily.  Marland Kitchen lisinopril-hydrochlorothiazide (PRINZIDE,ZESTORETIC) 20-12.5 MG tablet TAKE 1 TABLET BY MOUTH DAILY  . Multiple Vitamins-Minerals (MULTIVITAMIN ADULT) CHEW Chew 1 tablet by mouth daily.   . sildenafil (VIAGRA) 100 MG tablet Take 0.5-1 tablets (50-100 mg total) by mouth daily as needed for erectile dysfunction.    Allergies  Allergen Reactions  . Penicillins Swelling    Has patient had a PCN reaction causing immediate rash, facial/tongue/throat swelling, SOB or lightheadedness with hypotension: Y Has patient had a PCN reaction causing severe rash involving mucus membranes or skin necrosis: Y Has patient had a PCN reaction that required hospitalization: N Has patient had a PCN reaction occurring within the last 10 years: N If all of the above answers are "NO", then may proceed with Cephalosporin use.   . Sulfa Antibiotics Swelling    Social History   Tobacco Use  . Smoking status: Never Smoker  . Smokeless tobacco: Never Used  Substance Use Topics  . Alcohol use: Yes    Comment: social beer  . Drug use: No   Social History   Social History Narrative  . Not on file    family history includes Dementia in his father; Early death in his paternal grandfather; Hearing loss in his brother and father; Heart attack in his paternal grandfather.  Wt Readings from Last 3 Encounters:  09/14/18 256 lb 12.8 oz (116.5 kg)  09/12/18 257 lb 15 oz (117 kg)  09/06/18 258 lb 5 oz (117.2 kg)    PHYSICAL EXAM BP 110/84   Pulse 100   Ht 5\' 10"  (1.778 m)   Wt 256 lb 12.8 oz (116.5 kg)   SpO2 94%   BMI 36.85 kg/m  Physical Exam  Constitutional: He is oriented to person, place, and time. He appears well-developed and well-nourished. No distress.  Obese.  Well-groomed.  HENT:  Head: Normocephalic and atraumatic.  Eyes: Pupils are equal, round, and reactive to light. Conjunctivae and EOM are normal. No scleral icterus.  Neck:  Normal range of motion. Neck supple. No hepatojugular reflux and no JVD present. Carotid bruit is not present.  Cardiovascular: Regular rhythm, normal heart sounds, intact distal pulses and normal pulses. Tachycardia present. PMI is not displaced. Exam reveals no gallop and no friction rub.  No murmur heard. Pulmonary/Chest: Effort normal and breath sounds normal. No respiratory distress. He has no wheezes. He has no rales.  Abdominal: Soft. Bowel sounds are normal. He exhibits no distension. There is no tenderness. There is no rebound.  Obese.  No HSM  Musculoskeletal: Normal range of motion. He exhibits no edema.  Lymphadenopathy:    He has no cervical adenopathy.  Neurological: He is alert and oriented to person, place, and time. No cranial nerve deficit.  Psychiatric: He has a normal mood and affect. His behavior is normal. Judgment and thought content normal.  Vitals reviewed.  Adult ECG Report Not checked.  EKG showed right bundle branch block in the ER.  Other studies Reviewed: Additional studies/ records that were reviewed today include:  Recent Labs:   Lab Results  Component Value Date   CHOL 176 09/06/2018   HDL 36.00 (L) 09/06/2018   LDLCALC 112 (H) 09/06/2018   TRIG 142.0 09/06/2018   CHOLHDL 5 09/06/2018   Lab Results  Component Value Date   CREATININE 0.94 09/12/2018   BUN 18 09/12/2018   NA 135 09/12/2018   K 4.5 09/12/2018   CL 98 09/12/2018   CO2 28 09/12/2018       ASSESSMENT / PLAN: Problem List Items Addressed This Visit    Atypical angina ([place]) - Primary    Overall strange conglomeration of symptoms where he felt very poorly with nausea vomiting lightheadedness and irregular sensation in his chest.  Does not really sound like ACS, and he ruled out for MI.  However, he does have hypertension, hyperlipidemia, ED and is obese.  There were some concerning features based on his clinical presentation and therefore I think is not unreasonable to exclude an  ischemic etiology. Plan: Treadmill Myoview      Relevant Orders   MYOCARDIAL PERFUSION IMAGING   HTN (hypertension) (Chronic)    Stable blood pressure today on lisinopril and HCTZ.      Hyperlipidemia (Chronic)    Lipids just checked showing LDL 112.  Has not been started on statin.  Pending stress test results, may recommend restarting pravastatin.      Morbid obesity ([place]) (Chronic)    With hypertension and hyperlipidemia, BMI of 42 qualifies for morbid obesity.  He is also borderline metabolic syndrome.  He would like to think about exercising, but this recent hospital visit is scared him to the point where he is reluctant to exercise.  Hopefully if he has a Myoview that does not show any evidence of ischemic disease, he will feel comfortable.      RBBB   Relevant Orders   MYOCARDIAL PERFUSION IMAGING      I spent a total of 35 minutes with the patient and chart review. >  50% of the time was spent in direct patient consultation.   Current medicines are reviewed at length with the patient today.  (+/- concerns) n/a The following changes have been made:  n/a  Patient Instructions  MEDICATION INSTRUCTIONS  NO CHANGES AT PRESENT TIME  Studies Ordered:   Orders Placed This Encounter  Procedures  . MYOCARDIAL PERFUSION IMAGING      Glenetta Hew, M.D., M.S. Interventional Cardiologist   Pager # [phone] Phone # [phone] [address]. [place], [place] [address]   Thank you for choosing [facility]!!

## 2018-09-14 NOTE — Patient Instructions (Addendum)
MEDICATION INSTRUCTIONS  NO CHANGES AT PRESENT TIME      SCHEDULE AT [place] Your physician has requested that you have en exercise stress myoview. For further information please visit [email or web address]. Please follow instruction sheet, as given.     Your physician recommends that you schedule a follow-up appointment in [place].    If you need a refill on your cardiac medications before your next appointment, please call your pharmacy.    Cardiac Nuclear Scan A cardiac nuclear scan is a test that measures blood flow to the heart when a person is resting and when he or she is exercising. The test looks for problems such as:  Not enough blood reaching a portion of the heart.  The heart muscle not working normally.  You may need this test if:  You have heart disease.  You have had abnormal lab results.  You have had heart surgery or angioplasty.  You have chest pain.  You have shortness of breath.  In this test, a radioactive dye (tracer) is injected into your bloodstream. After the tracer has traveled to your heart, an imaging device is used to measure how much of the tracer is absorbed by or distributed to various areas of your heart. This procedure is usually done at a hospital and takes 2-4 hours. Tell a health care provider about:  Any allergies you have.  All medicines you are taking, including vitamins, herbs, eye drops, creams, and over-the-counter medicines.  Any problems you or family members have had with the use of anesthetic medicines.  Any blood disorders you have.  Any surgeries you have had.  Any medical conditions you have.  Whether you are pregnant or may be pregnant. What are the risks? Generally, this is a safe procedure. However, problems may occur, including:  Serious chest pain and heart attack. This is only a risk if the stress portion of the test is done.  Rapid heartbeat.  Sensation of warmth  in your chest. This usually passes quickly.  What happens before the procedure?  Ask your health care provider about changing or stopping your regular medicines. This is especially important if you are taking diabetes medicines or blood thinners.  Remove your jewelry on the day of the procedure. What happens during the procedure?  An IV tube will be inserted into one of your veins.  Your health care provider will inject a small amount of radioactive tracer through the tube.  You will wait for 20-40 minutes while the tracer travels through your bloodstream.  Your heart activity will be monitored with an electrocardiogram (ECG).  You will lie down on an exam table.  Images of your heart will be taken for about 15-20 minutes.  You may be asked to exercise on a treadmill or stationary bike. While you exercise, your heart's activity will be monitored with an ECG, and your blood pressure will be checked. If you are unable to exercise, you may be given a medicine to increase blood flow to parts of your heart.  When blood flow to your heart has peaked, a tracer will again be injected through the IV tube.  After 20-40 minutes, you will get back on the exam table and have more images taken of your heart.  When the procedure is over, your IV tube will be removed. The procedure may vary among health care providers and hospitals. Depending on the type of tracer used, scans may need to be repeated 3-4  hours later. What happens after the procedure?  Unless your health care provider tells you otherwise, you may return to your normal schedule, including diet, activities, and medicines.  Unless your health care provider tells you otherwise, you may increase your fluid intake. This will help flush the contrast dye from your body. Drink enough fluid to keep your urine clear or pale yellow.  It is up to you to get your test results. Ask your health care provider, or the department that is doing the  test, when your results will be ready. Summary  A cardiac nuclear scan measures the blood flow to the heart when a person is resting and when he or she is exercising.  You may need this test if you are at risk for heart disease.  Tell your health care provider if you are pregnant.  Unless your health care provider tells you otherwise, increase your fluid intake. This will help flush the contrast dye from your body. Drink enough fluid to keep your urine clear or pale yellow. This information is not intended to replace advice given to you by your health care provider. Make sure you discuss any questions you have with your health care provider. Document Released: 01/06/2005 Document Revised: 12/14/2016 Document Reviewed: 11/20/2013 Elsevier Interactive Patient Education  2017 [employer].

## 2018-09-17 ENCOUNTER — Other Ambulatory Visit: Payer: Self-pay | Admitting: Family Medicine

## 2018-09-17 ENCOUNTER — Encounter: Payer: Self-pay | Admitting: Cardiology

## 2018-09-17 NOTE — Assessment & Plan Note (Addendum)
Overall strange conglomeration of symptoms where he felt very poorly with nausea vomiting lightheadedness and irregular sensation in his chest.  Does not really sound like ACS, and he ruled out for MI.  However, he does have hypertension, hyperlipidemia, ED and is obese.  There were some concerning features based on his clinical presentation and therefore I think is not unreasonable to exclude an ischemic etiology. Plan: Treadmill Myoview

## 2018-09-17 NOTE — Assessment & Plan Note (Signed)
With hypertension and hyperlipidemia, BMI of 37 qualifies for morbid obesity.  He is also borderline metabolic syndrome.  He would like to think about exercising, but this recent hospital visit is scared him to the point where he is reluctant to exercise.  Hopefully if he has a Myoview that does not show any evidence of ischemic disease, he will feel comfortable.

## 2018-09-17 NOTE — Assessment & Plan Note (Signed)
Stable blood pressure today on lisinopril and HCTZ.

## 2018-09-17 NOTE — Assessment & Plan Note (Signed)
Lipids just checked showing LDL 112.  Has not been started on statin.  Pending stress test results, may recommend restarting pravastatin.

## 2018-09-18 ENCOUNTER — Telehealth (HOSPITAL_COMMUNITY): Payer: Self-pay

## 2018-09-18 NOTE — Telephone Encounter (Signed)
Encounter complete. 

## 2018-09-19 ENCOUNTER — Ambulatory Visit (HOSPITAL_COMMUNITY)
Admission: RE | Admit: 2018-09-19 | Discharge: 2018-09-19 | Disposition: A | Discharge status: Home / Self Care | Payer: 59 | Source: Ambulatory Visit | Attending: Cardiovascular Disease | Admitting: Cardiovascular Disease

## 2018-09-19 DIAGNOSIS — I4510 Unspecified right bundle-branch block: Secondary | ICD-10-CM | POA: Insufficient documentation

## 2018-09-19 DIAGNOSIS — I208 Other forms of angina pectoris: Secondary | ICD-10-CM

## 2018-09-19 LAB — MYOCARDIAL PERFUSION IMAGING
Estimated workload: 8.3 METS
Exercise duration (min): 8 min
Exercise duration (sec): 0 s
LV dias vol: 86 mL (ref 62–150)
LV sys vol: 33 mL
MPHR: 156 {beats}/min
Peak HR: 150 {beats}/min
Percent HR: 96 %
RPE: 19
Rest HR: 73 {beats}/min
SDS: 5
SRS: 0
SSS: 5
TID: 0.85

## 2018-09-19 MED ORDER — TECHNETIUM TC 99M TETROFOSMIN IV KIT
30.5000 | PACK | Freq: Once | INTRAVENOUS | Status: AC | PRN
  Administered 2018-09-19: 30.5 via INTRAVENOUS
  Filled 2018-09-19: qty 31

## 2018-09-19 MED ORDER — TECHNETIUM TC 99M TETROFOSMIN IV KIT
8.4000 | PACK | Freq: Once | INTRAVENOUS | Status: AC | PRN
  Administered 2018-09-19: 8.4 via INTRAVENOUS
  Filled 2018-09-19: qty 9

## 2018-09-25 ENCOUNTER — Telehealth: Payer: Self-pay | Admitting: *Deleted

## 2018-09-25 NOTE — Telephone Encounter (Signed)
Pt advised Myoview results and will cancel follow up since he says Dr. Ellyn Hack told him no follow up if results came back low risk. Pt to continue to follow up with his pmd and will call if he needs [place].

## 2018-09-25 NOTE — Telephone Encounter (Signed)
Left message to call back -  myoview results

## 2018-09-25 NOTE — Telephone Encounter (Signed)
-----   Message from Leonie Man, MD sent at 09/20/2018  8:01 PM EDT ----- Nuclear stress test result is normal.  Normal pump function.  Normal EKG with exercise. Relatively normal exercise tolerance.  No evidence of ischemia (heart artery blockages) or prior heart attack.   LOW RISK study.  This means the chest pain or shortness of breath is low likelihood of being cardiac.   Glenetta Hew, MD

## 2018-10-08 ENCOUNTER — Ambulatory Visit: Payer: 59 | Admitting: Cardiology

## 2019-03-06 ENCOUNTER — Ambulatory Visit: Payer: 59 | Admitting: Family Medicine

## 2019-03-07 ENCOUNTER — Ambulatory Visit: Payer: 59 | Admitting: Family Medicine

## 2019-03-13 ENCOUNTER — Other Ambulatory Visit: Payer: Self-pay

## 2019-03-13 ENCOUNTER — Ambulatory Visit: Payer: 59 | Admitting: Family Medicine

## 2019-03-13 ENCOUNTER — Encounter: Payer: Self-pay | Admitting: Family Medicine

## 2019-03-13 VITALS — BP 126/82 | HR 85 | Temp 98.4°F | Resp 16 | Ht 70.0 in | Wt 257.1 lb

## 2019-03-13 DIAGNOSIS — M62830 Muscle spasm of back: Secondary | ICD-10-CM | POA: Insufficient documentation

## 2019-03-13 DIAGNOSIS — I10 Essential (primary) hypertension: Secondary | ICD-10-CM | POA: Diagnosis not present

## 2019-03-13 DIAGNOSIS — E782 Mixed hyperlipidemia: Secondary | ICD-10-CM

## 2019-03-13 DIAGNOSIS — Z23 Encounter for immunization: Secondary | ICD-10-CM

## 2019-03-13 LAB — CBC WITH DIFFERENTIAL/PLATELET
Basophils Absolute: 0.1 10*3/uL (ref 0.0–0.1)
Basophils Relative: 1.3 % (ref 0.0–3.0)
Eosinophils Absolute: 0.3 10*3/uL (ref 0.0–0.7)
Eosinophils Relative: 4.2 % (ref 0.0–5.0)
HCT: 42.4 % (ref 39.0–52.0)
Hemoglobin: 14.5 g/dL (ref 13.0–17.0)
Lymphocytes Relative: 18.8 % (ref 12.0–46.0)
Lymphs Abs: 1.2 10*3/uL (ref 0.7–4.0)
MCHC: 34.1 g/dL (ref 30.0–36.0)
MCV: 89.2 fl (ref 78.0–100.0)
Monocytes Absolute: 0.6 10*3/uL (ref 0.1–1.0)
Monocytes Relative: 8.3 % (ref 3.0–12.0)
Neutro Abs: 4.5 10*3/uL (ref 1.4–7.7)
Neutrophils Relative %: 67.4 % (ref 43.0–77.0)
Platelets: 156 10*3/uL (ref 150.0–400.0)
RBC: 4.75 Mil/uL (ref 4.22–5.81)
RDW: 14 % (ref 11.5–15.5)
WBC: 6.6 10*3/uL (ref 4.0–10.5)

## 2019-03-13 LAB — LIPID PANEL
Cholesterol: 169 mg/dL (ref 0–200)
HDL: 32.3 mg/dL — ABNORMAL LOW (ref >39.00)
LDL Cholesterol: 115 mg/dL — ABNORMAL HIGH (ref 0–99)
NonHDL: 136.45
Total CHOL/HDL Ratio: 5
Triglycerides: 105 mg/dL (ref 0.0–149.0)
VLDL: 21 mg/dL (ref 0.0–40.0)

## 2019-03-13 LAB — BASIC METABOLIC PANEL
BUN: 20 mg/dL (ref 6–23)
CO2: 31 mEq/L (ref 19–32)
Calcium: 9.3 mg/dL (ref 8.4–10.5)
Chloride: 99 mEq/L (ref 96–112)
Creatinine, Ser: 1.07 mg/dL (ref 0.40–1.50)
GFR: 69.33 mL/min (ref >60.00)
Glucose, Bld: 116 mg/dL — ABNORMAL HIGH (ref 70–99)
Potassium: 4.4 mEq/L (ref 3.5–5.1)
Sodium: 138 mEq/L (ref 135–145)

## 2019-03-13 LAB — HEPATIC FUNCTION PANEL
ALT: 25 U/L (ref 0–53)
AST: 19 U/L (ref 0–37)
Albumin: 4.4 g/dL (ref 3.5–5.2)
Alkaline Phosphatase: 45 U/L (ref 39–117)
Bilirubin, Direct: 0.2 mg/dL (ref 0.0–0.3)
Total Bilirubin: 0.9 mg/dL (ref 0.2–1.2)
Total Protein: 7.3 g/dL (ref 6.0–8.3)

## 2019-03-13 LAB — TSH: TSH: 1.81 u[IU]/mL (ref 0.35–4.50)

## 2019-03-13 MED ORDER — METHOCARBAMOL 750 MG PO TABS: 750.0000 mg | ORAL_TABLET | Freq: Three times a day (TID) | ORAL | 0 refills | Status: DC | PRN

## 2019-03-13 NOTE — Assessment & Plan Note (Signed)
New.  Only occurs when lying flat and rolling to R.  Start Robaxin.  Heat.  Reviewed supportive care and red flags that should prompt return.  Pt expressed understanding and is in agreement w/ plan.

## 2019-03-13 NOTE — Addendum Note (Signed)
Addended by: Davis Gourd on: 03/13/2019 09:36 AM   Modules accepted: Orders

## 2019-03-13 NOTE — Patient Instructions (Signed)
Schedule your complete physical in 6 months We'll notify you of your lab results and make any changes if needed Continue to work on healthy diet and regular exercise- you can do it! Call with any questions or concerns Stay Safe!!! 

## 2019-03-13 NOTE — Assessment & Plan Note (Signed)
Ongoing issue for pt.  Stressed need for healthy diet and regular exercise.  Check labs to risk stratify.  Will follow 

## 2019-03-13 NOTE — Progress Notes (Signed)
   Subjective:    Patient ID: Jason Bartlett, male    DOB: 11-Sep-1956, 63 y.o.   MRN: [id number]  HPI HTN- chronic problem, on Lisinopril HCTZ 20/12.5mg  w/ adequate control.  No CP, SOB, HAs, visual changes, edema.  Hyperlipidemia- chronic problem, attempting to control w/ diet and exercise.  No abd pain, N/V.  Obesity- pt's BMI is 36.89.  Given the combination of HTN and hyperlipidemia pt qualifies as morbidly obese.  Colonoscopy- done 2-3 yrs ago at [facility]  Review of Systems For ROS see HPI     Objective:   Physical Exam Constitutional:      General: He is not in acute distress.    Appearance: He is well-developed. He is obese. He is not ill-appearing.  HENT:     Head: Normocephalic and atraumatic.  Eyes:     Conjunctiva/sclera: Conjunctivae normal.     Pupils: Pupils are equal, round, and reactive to light.  Neck:     Musculoskeletal: Normal range of motion and neck supple.     Thyroid: No thyromegaly.  Cardiovascular:     Rate and Rhythm: Normal rate and regular rhythm.     Heart sounds: Normal heart sounds. No murmur.  Pulmonary:     Effort: Pulmonary effort is normal. No respiratory distress.     Breath sounds: Normal breath sounds.  Abdominal:     General: Bowel sounds are normal. There is no distension.     Palpations: Abdomen is soft.  Lymphadenopathy:     Cervical: No cervical adenopathy.  Skin:    General: Skin is warm and dry.  Neurological:     Mental Status: He is alert and oriented to person, place, and time.     Cranial Nerves: No cranial nerve deficit.  Psychiatric:        Behavior: Behavior normal.           Assessment & Plan:

## 2019-03-13 NOTE — Assessment & Plan Note (Signed)
Chronic problem.  Ongoing issue.  Attempting to control w/ diet and exercise.  Check labs and adjust tx prn.

## 2019-03-13 NOTE — Assessment & Plan Note (Signed)
Chronic problem.  Adequate control.  Asymptomatic.  Check labs.  No anticipated med changes.  Will follow. 

## 2019-03-14 ENCOUNTER — Other Ambulatory Visit (INDEPENDENT_AMBULATORY_CARE_PROVIDER_SITE_OTHER): Payer: 59

## 2019-03-14 DIAGNOSIS — R7309 Other abnormal glucose: Secondary | ICD-10-CM | POA: Diagnosis not present

## 2019-03-14 LAB — HEMOGLOBIN A1C: Hgb A1c MFr Bld: 6.3 % (ref 4.6–6.5)

## 2019-05-09 ENCOUNTER — Other Ambulatory Visit: Payer: Self-pay | Admitting: Family Medicine

## 2019-06-11 ENCOUNTER — Other Ambulatory Visit: Payer: Self-pay | Admitting: Family Medicine

## 2019-07-27 ENCOUNTER — Other Ambulatory Visit: Payer: Self-pay | Admitting: Family Medicine

## 2019-09-16 ENCOUNTER — Ambulatory Visit: Payer: 59 | Admitting: Family Medicine

## 2019-09-19 ENCOUNTER — Encounter: Payer: 59 | Admitting: Family Medicine

## 2019-10-15 ENCOUNTER — Encounter: Payer: Self-pay | Admitting: Family Medicine

## 2019-10-15 ENCOUNTER — Other Ambulatory Visit: Payer: Self-pay | Admitting: General Practice

## 2019-10-15 ENCOUNTER — Other Ambulatory Visit: Payer: Self-pay

## 2019-10-15 ENCOUNTER — Ambulatory Visit (INDEPENDENT_AMBULATORY_CARE_PROVIDER_SITE_OTHER): Payer: 59 | Admitting: Family Medicine

## 2019-10-15 VITALS — BP 124/80 | HR 88 | Temp 97.8°F | Resp 16 | Ht 70.0 in | Wt 257.0 lb

## 2019-10-15 DIAGNOSIS — Z125 Encounter for screening for malignant neoplasm of prostate: Secondary | ICD-10-CM | POA: Diagnosis not present

## 2019-10-15 DIAGNOSIS — M62830 Muscle spasm of back: Secondary | ICD-10-CM | POA: Diagnosis not present

## 2019-10-15 DIAGNOSIS — I10 Essential (primary) hypertension: Secondary | ICD-10-CM | POA: Diagnosis not present

## 2019-10-15 DIAGNOSIS — Z0000 Encounter for general adult medical examination without abnormal findings: Secondary | ICD-10-CM

## 2019-10-15 DIAGNOSIS — Z23 Encounter for immunization: Secondary | ICD-10-CM | POA: Diagnosis not present

## 2019-10-15 LAB — CBC WITH DIFFERENTIAL/PLATELET
Basophils Absolute: 0.1 10*3/uL (ref 0.0–0.1)
Basophils Relative: 1.2 % (ref 0.0–3.0)
Eosinophils Absolute: 0.4 10*3/uL (ref 0.0–0.7)
Eosinophils Relative: 5.3 % — ABNORMAL HIGH (ref 0.0–5.0)
HCT: 42.7 % (ref 39.0–52.0)
Hemoglobin: 14.3 g/dL (ref 13.0–17.0)
Lymphocytes Relative: 19.8 % (ref 12.0–46.0)
Lymphs Abs: 1.6 10*3/uL (ref 0.7–4.0)
MCHC: 33.3 g/dL (ref 30.0–36.0)
MCV: 91 fl (ref 78.0–100.0)
Monocytes Absolute: 0.6 10*3/uL (ref 0.1–1.0)
Monocytes Relative: 8.2 % (ref 3.0–12.0)
Neutro Abs: 5.2 10*3/uL (ref 1.4–7.7)
Neutrophils Relative %: 65.5 % (ref 43.0–77.0)
Platelets: 204 10*3/uL (ref 150.0–400.0)
RBC: 4.7 Mil/uL (ref 4.22–5.81)
RDW: 14 % (ref 11.5–15.5)
WBC: 7.9 10*3/uL (ref 4.0–10.5)

## 2019-10-15 LAB — TSH: TSH: 1.79 u[IU]/mL (ref 0.35–4.50)

## 2019-10-15 LAB — BASIC METABOLIC PANEL
BUN: 24 mg/dL — ABNORMAL HIGH (ref 6–23)
CO2: 28 mEq/L (ref 19–32)
Calcium: 10.1 mg/dL (ref 8.4–10.5)
Chloride: 99 mEq/L (ref 96–112)
Creatinine, Ser: 1.11 mg/dL (ref 0.40–1.50)
GFR: 66.33 mL/min (ref >60.00)
Glucose, Bld: 93 mg/dL (ref 70–99)
Potassium: 4.9 mEq/L (ref 3.5–5.1)
Sodium: 135 mEq/L (ref 135–145)

## 2019-10-15 LAB — HEPATIC FUNCTION PANEL
ALT: 35 U/L (ref 0–53)
AST: 35 U/L (ref 0–37)
Albumin: 4.6 g/dL (ref 3.5–5.2)
Alkaline Phosphatase: 46 U/L (ref 39–117)
Bilirubin, Direct: 0.2 mg/dL (ref 0.0–0.3)
Total Bilirubin: 1.1 mg/dL (ref 0.2–1.2)
Total Protein: 8.1 g/dL (ref 6.0–8.3)

## 2019-10-15 LAB — HEMOGLOBIN A1C: Hgb A1c MFr Bld: 6.4 % (ref 4.6–6.5)

## 2019-10-15 LAB — PSA: PSA: 9.77 ng/mL — ABNORMAL HIGH (ref 0.10–4.00)

## 2019-10-15 LAB — LIPID PANEL
Cholesterol: 202 mg/dL — ABNORMAL HIGH (ref 0–200)
HDL: 30.4 mg/dL — ABNORMAL LOW (ref >39.00)
LDL Cholesterol: 135 mg/dL — ABNORMAL HIGH (ref 0–99)
NonHDL: 171.56
Total CHOL/HDL Ratio: 7
Triglycerides: 184 mg/dL — ABNORMAL HIGH (ref 0.0–149.0)
VLDL: 36.8 mg/dL (ref 0.0–40.0)

## 2019-10-15 MED ORDER — METHOCARBAMOL 750 MG PO TABS: ORAL_TABLET | ORAL | 0 refills | Status: DC

## 2019-10-15 MED ORDER — SILDENAFIL CITRATE 100 MG PO TABS: 50.0000 mg | ORAL_TABLET | Freq: Every day | ORAL | 11 refills | Status: DC | PRN

## 2019-10-15 NOTE — Assessment & Plan Note (Signed)
Ongoing problem for pt.  He is asking for refill on Robaxin and HEP to improve pain.  Refill provided, will refer to PT for complete assessment and personalized HEP.  Pt expressed understanding and is in agreement w/ plan.

## 2019-10-15 NOTE — Progress Notes (Signed)
   Subjective:    Patient ID: Jason Bartlett, male    DOB: 1956-10-08, 63 y.o.   MRN: [id number]  HPI CPE- UTD on colonoscopy, flu, Tdap.  Needs 2nd Shingrix.     Review of Systems Patient reports no vision/hearing changes, anorexia, fever ,adenopathy, persistant/recurrent hoarseness, swallowing issues, chest pain, palpitations, edema, persistant/recurrent cough, hemoptysis, dyspnea (rest,exertional, paroxysmal nocturnal), gastrointestinal  bleeding (melena, rectal bleeding), abdominal pain, excessive heart burn, GU symptoms (dysuria, hematuria, voiding/incontinence issues) syncope, focal weakness, memory loss, skin/hair/nail changes, depression, anxiety, abnormal bruising/bleeding.    + intermittent numbness of feet + chronic back pain- asking for refill on Robaxin and a home exercise program.  Continues to have pain, difficult time lying down.    Objective:   Physical Exam BP 124/80   Pulse 88   Temp 97.8 F (36.6 C) (Tympanic)   Resp 16   Ht 5\' 10"  (1.778 m)   Wt 257 lb (116.6 kg)   SpO2 97%   BMI 36.88 kg/m   General Appearance:    Alert, cooperative, no distress, appears stated age, obese  Head:    Normocephalic, without obvious abnormality, atraumatic  Eyes:    PERRL, conjunctiva/corneas clear, EOM's intact, fundi    benign, both eyes       Ears:    Normal TM's and external ear canals, both ears  Nose:   Deferred due to COVID  Throat:   Neck:   Supple, symmetrical, trachea midline, no adenopathy;       thyroid:  No enlargement/tenderness/nodules  Back:     Symmetric, no curvature, ROM normal, no CVA tenderness  Lungs:     Clear to auscultation bilaterally, respirations unlabored  Chest wall:    No tenderness or deformity  Heart:    Regular rate and rhythm, S1 and S2 normal, no murmur, rub   or gallop  Abdomen:     Soft, non-tender, bowel sounds active all four quadrants,    no masses, no organomegaly  Genitalia:    Normal male without lesion, discharge or  tenderness  Rectal:    Normal tone, normal prostate, no masses or tenderness  Extremities:   Extremities normal, atraumatic, no cyanosis or edema  Pulses:   2+ and symmetric all extremities  Skin:   Skin color, texture, turgor normal, no rashes or lesions  Lymph nodes:   Cervical, supraclavicular, and axillary nodes normal  Neurologic:   CNII-XII intact. Normal strength, sensation and reflexes      throughout          Assessment & Plan:

## 2019-10-15 NOTE — Assessment & Plan Note (Signed)
Chronic problem.  Well controlled.  Asymptomatic.  Check labs.  No anticipated med changes.  Will follow. 

## 2019-10-15 NOTE — Progress Notes (Signed)
Jason Bartlett 63 y.o. male presents to office today for annual physical. Administered second shingles vaccine, SHINGRIX 0.5 mL IM, left arm per Annye Asa, MD. Patient tolerated well.

## 2019-10-15 NOTE — Assessment & Plan Note (Signed)
Again stressed need for healthy diet and regular exercise.  Check labs to risk stratify.  Will follow.

## 2019-10-15 NOTE — Patient Instructions (Signed)
Follow up in 6 months to recheck BP and weight loss progress We'll notify you of your lab results and make any changes if needed We'll call you with your PT referral for the back pain Continue to work on healthy diet and regular exercise- you can do it! Call with any questions or concerns Stay Safe!!!

## 2019-10-15 NOTE — Assessment & Plan Note (Signed)
PE unchanged from previous.  UTD on colonoscopy, Tdap, flu.  2nd Shingrix given today.  Check labs.  Anticipatory guidance provided.

## 2019-10-16 ENCOUNTER — Other Ambulatory Visit: Payer: Self-pay | Admitting: Family Medicine

## 2019-10-16 ENCOUNTER — Encounter: Payer: Self-pay | Admitting: General Practice

## 2019-10-16 DIAGNOSIS — R972 Elevated prostate specific antigen [PSA]: Secondary | ICD-10-CM

## 2019-10-31 ENCOUNTER — Encounter: Payer: Self-pay | Admitting: Physical Therapy

## 2019-10-31 ENCOUNTER — Ambulatory Visit: Payer: 59 | Attending: Family Medicine | Admitting: Physical Therapy

## 2019-10-31 ENCOUNTER — Other Ambulatory Visit: Payer: Self-pay

## 2019-10-31 DIAGNOSIS — G8929 Other chronic pain: Secondary | ICD-10-CM | POA: Diagnosis present

## 2019-10-31 DIAGNOSIS — M6281 Muscle weakness (generalized): Secondary | ICD-10-CM | POA: Insufficient documentation

## 2019-10-31 DIAGNOSIS — M62830 Muscle spasm of back: Secondary | ICD-10-CM | POA: Diagnosis present

## 2019-10-31 DIAGNOSIS — M545 Low back pain: Secondary | ICD-10-CM | POA: Diagnosis not present

## 2019-10-31 NOTE — Therapy (Signed)
[place], [place], [address] Phone: [phone]   Fax:  [phone]  Physical Therapy Evaluation  Patient Details  Name: Jason Bartlett MRN: [id number] Date of Birth: 1956-07-17 Referring Provider (PT): Midge Minium, MD   Encounter Date: 10/31/2019  PT End of Session - 10/31/19 0943    Visit Number  1    Number of Visits  13    Date for PT Re-Evaluation  12/13/19    Authorization Type  UHC    PT Start Time  0935    PT Stop Time  1020    PT Time Calculation (min)  45 min    Activity Tolerance  Patient tolerated treatment well    Behavior During Therapy  [facility] for tasks assessed/performed       Past Medical History:  Diagnosis Date  . GERD (gastroesophageal reflux disease)   . History of chicken pox   . Hyperlipidemia   . Hypertension     History reviewed. No pertinent surgical history.  There were no vitals filed for this visit.   Subjective Assessment - 10/31/19 0937    Subjective  Rt side hurts most when I try to scoot and roll over in bed. Off and on for about 3 months, insidious onset. Works at [employer] and I stand a lot. trying to bend and work with some machines is painful. Denies radicular symptoms. I have a little bit of neck pain that started recently. May be a kidney stone- never had one but my dad had them and they were very painful.    Currently in Pain?  Yes    Pain Location  Back    Pain Orientation  Left    Pain Descriptors / Indicators  Spasm    Aggravating Factors   rolling in bed    Pain Relieving Factors  standing         [facility] PT Assessment - 10/31/19 0001      Assessment   Medical Diagnosis  back spasm    Referring Provider (PT)  Midge Minium, MD    Onset Date/Surgical Date  --   3 months   Hand Dominance  Right      Precautions   Precautions  None      Restrictions   Weight Bearing Restrictions  No      Balance Screen   Has the patient fallen in  the past 6 months  No      [place] residence    Living Arrangements  Spouse/significant other      Prior Function   Level of Independence  Independent    Vocation  Full time employment    Vocation Requirements  in charge of maintenance at [employer]   Overall Cognitive Status  Within Functional Limits for tasks assessed      Observation/Other Assessments   Focus on Therapeutic Outcomes (FOTO)   42% limited      Sensation   Additional Comments  [facility]      Posture/Postural Control   Posture Comments  incr thoracic kyphosis, apparent lumbar levoscoliosis      ROM / Strength   AROM / PROM / Strength  Strength      Strength   Strength Assessment Site  Hip    Right/Left Hip  Right;Left    Right Hip Flexion  5/5    Right Hip ABduction  4/5  Left Hip Flexion  4/5    Left Hip ABduction  4/5      Palpation   Palpation comment  Rt post pelvic rotation                Objective measurements completed on examination: See above findings.      [facility] Adult PT Treatment/Exercise - 10/31/19 0001      Exercises   Exercises  Lumbar      Lumbar Exercises: Stretches   Other Lumbar Stretch Exercise  LTR      Lumbar Exercises: Supine   Other Supine Lumbar Exercises  adduction ball bw knees with pelvic tilt      Manual Therapy   Manual Therapy  Soft tissue mobilization    Manual therapy comments  edu on use of tennis ball    Soft tissue mobilization  Rt QL & Gluts around SIJ             PT Education - 10/31/19 1657    Education Details  anatomy of condition, POC, HEP, exercise form/rationale    Person(s) Educated  Patient    Methods  Explanation;Demonstration;Tactile cues;Verbal cues;Handout    Comprehension  Verbalized understanding;Returned demonstration;Verbal cues required;Tactile cues required;Need further instruction          PT Long Term Goals - 10/31/19 1252      PT LONG TERM GOAL #1   Title  Gross  Le strength to 5/5    Baseline  see flowsheet    Time  6    Period  Weeks    Status  New    Target Date  12/13/19      PT LONG TERM GOAL #2   Title  Pt will perform bed mobility without increase in LBP    Baseline  severe at eval    Time  6    Period  Weeks    Status  New    Target Date  12/13/19      PT LONG TERM GOAL #3   Title  Pt will be able to bend and stoop to complete work activities    Baseline  significantly limited by pain    Time  6    Period  Weeks    Status  New    Target Date  12/13/19      PT LONG TERM GOAL #4   Title  able to demonstrate proper posture for biomechanical chain support    Baseline  leaning to Lt side with increased thoracic kyphosis resulting in neck pain    Time  6    Period  Weeks    Status  New    Target Date  12/13/19             Plan - 10/31/19 1151    Clinical Impression Statement  pt presents to PT with complaints of Rt LBP of isidious onset about 3 months ago. Notable post Lt innominate rotation with resulting spasm of QL and glut max. concordant pain reduced with TPR to Rt glut max. Will benefit from skilled PT in order to improve lumbopelvic stability to decrease spasm and meet functional goals.    Examination-Activity Limitations  Locomotion Level;Bed Mobility;Bend;Sit;Sleep;Squat;Stand;Lift    Examination-Participation Restrictions  --   work activities   Stability/Clinical Decision Making  Stable/Uncomplicated    Clinical Decision Making  Low    Rehab Potential  Good    PT Frequency  2x / week    PT Duration  6 weeks  PT Treatment/Interventions  ADLs/Self Care Home Management;Cryotherapy;Electrical Stimulation;Ultrasound;Traction;Moist Heat;Iontophoresis 4mg /ml Dexamethasone;Functional mobility training;Therapeutic activities;Therapeutic exercise;Patient/family education;Neuromuscular re-education;Manual techniques;Passive range of motion;Dry needling;Spinal Manipulations;Taping;Joint Manipulations    PT Next Visit Plan   DN to Rt gluts & QL, progress core and lumbopelvic stability    PT Home Exercise Plan  LTR, hooklying ball squeeze, tennis ball STM    Consulted and Agree with Plan of Care  Patient       Patient will benefit from skilled therapeutic intervention in order to improve the following deficits and impairments:  Increased muscle spasms, Decreased activity tolerance, Pain, Improper body mechanics, Impaired flexibility, Decreased strength, Postural dysfunction  Visit Diagnosis: Chronic right-sided low back pain without sciatica - Plan: PT plan of care cert/re-cert  Muscle weakness (generalized) - Plan: PT plan of care cert/re-cert  Muscle spasm of back - Plan: PT plan of care cert/re-cert     Problem List Patient Active Problem List   Diagnosis Date Noted  . Back spasm 03/13/2019  . RBBB 09/14/2018  . Physical exam 09/06/2018  . Umbilical hernia [id number]  . Hyperlipidemia 02/08/2018  . HTN (hypertension) 02/08/2018  . Morbid obesity ([place]) 02/08/2018  . Erectile dysfunction 02/08/2018    Bexlee Bergdoll C. Nishi Neiswonger PT, DPT 10/31/19 5:00 PM   [place], [place], [address] Phone: [phone]   Fax:  [phone]  Name: ASHA SEARS MRN: [id number] Date of Birth: 1956-07-29

## 2019-11-14 ENCOUNTER — Encounter: Payer: Self-pay | Admitting: Physical Therapy

## 2019-11-14 ENCOUNTER — Ambulatory Visit: Payer: 59 | Admitting: Physical Therapy

## 2019-11-14 ENCOUNTER — Other Ambulatory Visit: Payer: Self-pay

## 2019-11-14 DIAGNOSIS — M545 Low back pain: Secondary | ICD-10-CM | POA: Diagnosis not present

## 2019-11-14 DIAGNOSIS — M6281 Muscle weakness (generalized): Secondary | ICD-10-CM

## 2019-11-14 DIAGNOSIS — M62830 Muscle spasm of back: Secondary | ICD-10-CM

## 2019-11-14 DIAGNOSIS — G8929 Other chronic pain: Secondary | ICD-10-CM

## 2019-11-14 NOTE — Patient Instructions (Signed)

## 2019-11-14 NOTE — Therapy (Signed)
[place], [place], [address] Phone: [phone]   Fax:  [phone]  Physical Therapy Treatment  Patient Details  Name: Jason Bartlett MRN: [id number] Date of Birth: Jul 14, 1956 Referring Provider (PT): Midge Minium, MD   Encounter Date: 11/14/2019  PT End of Session - 11/14/19 1055    Visit Number  2    Number of Visits  13    Date for PT Re-Evaluation  12/13/19    Authorization Type  UHC    PT Start Time  1016    PT Stop Time  1057    PT Time Calculation (min)  41 min    Activity Tolerance  Patient tolerated treatment well    Behavior During Therapy  [facility] for tasks assessed/performed       Past Medical History:  Diagnosis Date  . GERD (gastroesophageal reflux disease)   . History of chicken pox   . Hyperlipidemia   . Hypertension     History reviewed. No pertinent surgical history.  There were no vitals filed for this visit.  Subjective Assessment - 11/14/19 1017    Subjective  Pt. reports "good changes" since last visit. He has noticed association with his gait mechanics from limping due to right foot issues-he got new shoes as well as shoe inserts and reports this has helped. Still has limitation for right sidelying position.    Currently in Pain?  No/denies         [facility] PT Assessment - 11/14/19 0001      Palpation   Palpation comment  No innominate rotation noted today                   [facility] Adult PT Treatment/Exercise - 11/14/19 0001      Lumbar Exercises: Stretches   Piriformis Stretch  Right;3 reps;30 seconds    Other Lumbar Stretch Exercise  R glut stretch 3x30 sec, manual R QL stretch 3x30 sec      Lumbar Exercises: Supine   Pelvic Tilt  15 reps    Clam  15 reps    Clam Limitations  Green Theraband    Bridge with [employer]  15 reps      Manual Therapy   Manual Therapy  Joint mobilization;Soft tissue mobilization;Muscle Energy Technique     Joint Mobilization  R hip LAD oscillations grade I-III    Soft tissue mobilization  Rt QL & Gluts around SIJ    Muscle Energy Technique  "shotgun" 5 x 5 sec ea.       Trigger Point Dry Needling - 11/14/19 0001    Consent Given?  Yes    Education Handout Provided  Yes    Muscles Treated Back/Hip  Gluteus maximus;Quadratus lumborum    Dry Needling Comments  needled in left sidelying with 75 mm 30 gauge needles    Electrical Stimulation Performed with Dry Needling  Yes    E-stim with Dry Needling Details  TENS 2 pps x 10 minutes           PT Education - 11/14/19 1055    Education Details  dry needling    Person(s) Educated  Patient    Methods  Explanation;Verbal cues;Handout    Comprehension  Verbalized understanding          PT Long Term Goals - 10/31/19 1252      PT LONG TERM GOAL #1   Title  Gross Le strength to 5/5    Baseline  see flowsheet    Time  6    Period  Weeks    Status  New    Target Date  12/13/19      PT LONG TERM GOAL #2   Title  Pt will perform bed mobility without increase in LBP    Baseline  severe at eval    Time  6    Period  Weeks    Status  New    Target Date  12/13/19      PT LONG TERM GOAL #3   Title  Pt will be able to bend and stoop to complete work activities    Baseline  significantly limited by pain    Time  6    Period  Weeks    Status  New    Target Date  12/13/19      PT LONG TERM GOAL #4   Title  able to demonstrate proper posture for biomechanical chain support    Baseline  leaning to Lt side with increased thoracic kyphosis resulting in neck pain    Time  6    Period  Weeks    Status  New    Target Date  12/13/19            Plan - 11/14/19 1056    Clinical Impression Statement  Good progress with therapy so far with response to METs and manual and also likely assisted by change in footwear/gait mechanics as noted in subjective. Trial dry needling today to right QL and glut which was well-tolerated. Plan  continue PT per POC from eval for further progress to ease pain and address aossociated functional limitations.    Examination-Activity Limitations  Locomotion Level;Bed Mobility;Bend;Sit;Sleep;Squat;Stand;Lift    Stability/Clinical Decision Making  Stable/Uncomplicated    Clinical Decision Making  Low    Rehab Potential  Good    PT Frequency  2x / week    PT Duration  6 weeks    PT Treatment/Interventions  ADLs/Self Care Home Management;Cryotherapy;Electrical Stimulation;Ultrasound;Traction;Moist Heat;Iontophoresis 4mg /ml Dexamethasone;Functional mobility training;Therapeutic activities;Therapeutic exercise;Patient/family education;Neuromuscular re-education;Manual techniques;Passive range of motion;Dry needling;Spinal Manipulations;Taping;Joint Manipulations    PT Next Visit Plan  continue DN to Rt gluts & QL as found beneficial, check for innominate rotation prn, manual,  progress core and lumbopelvic stability    PT Home Exercise Plan  LTR, hooklying ball squeeze, tennis ball STM    Consulted and Agree with Plan of Care  Patient       Patient will benefit from skilled therapeutic intervention in order to improve the following deficits and impairments:  Increased muscle spasms, Decreased activity tolerance, Pain, Improper body mechanics, Impaired flexibility, Decreased strength, Postural dysfunction  Visit Diagnosis: Chronic right-sided low back pain without sciatica  Muscle weakness (generalized)  Muscle spasm of back     Problem List Patient Active Problem List   Diagnosis Date Noted  . Back spasm 03/13/2019  . RBBB 09/14/2018  . Physical exam 09/06/2018  . Umbilical hernia [id number]  . Hyperlipidemia 02/08/2018  . HTN (hypertension) 02/08/2018  . Morbid obesity ([place]) 02/08/2018  . Erectile dysfunction 02/08/2018    Beaulah Dinning, PT, DPT 11/14/19 11:05 AM  [facility] [address] [place], [place],  [address] Phone: [phone]   Fax:  [phone]  Name: Jason Bartlett MRN: [id number] Date of Birth: 07/28/1956

## 2019-11-15 ENCOUNTER — Ambulatory Visit: Payer: 59 | Admitting: Physical Therapy

## 2019-11-15 ENCOUNTER — Encounter: Payer: Self-pay | Admitting: Physical Therapy

## 2019-11-15 DIAGNOSIS — M6281 Muscle weakness (generalized): Secondary | ICD-10-CM

## 2019-11-15 DIAGNOSIS — M62830 Muscle spasm of back: Secondary | ICD-10-CM

## 2019-11-15 DIAGNOSIS — G8929 Other chronic pain: Secondary | ICD-10-CM

## 2019-11-15 DIAGNOSIS — M545 Low back pain: Secondary | ICD-10-CM | POA: Diagnosis not present

## 2019-11-15 NOTE — Therapy (Signed)
[place], [place], [address] Phone: [phone]   Fax:  [phone]  Physical Therapy Treatment/Discharge  Patient Details  Name: Jason Bartlett MRN: [id number] Date of Birth: 1956/07/24 Referring Provider (PT): Midge Minium, MD   Encounter Date: 11/15/2019  PT End of Session - 11/15/19 0835    Visit Number  3    Number of Visits  13    Date for PT Re-Evaluation  12/13/19    Authorization Type  UHC    PT Start Time  0835    PT Stop Time  0859    PT Time Calculation (min)  24 min    Activity Tolerance  Patient tolerated treatment well    Behavior During Therapy  [facility] for tasks assessed/performed       Past Medical History:  Diagnosis Date  . GERD (gastroesophageal reflux disease)   . History of chicken pox   . Hyperlipidemia   . Hypertension     History reviewed. No pertinent surgical history.  There were no vitals filed for this visit.  Subjective Assessment - 11/15/19 0836    Subjective  I was able to lay on my Right side. My back feels so much better. I am making myself not limp and got new shoes. [place] behind little toe.         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[employer]      Observation/Other Assessments   Focus on Therapeutic Outcomes (FOTO)   18% limited      Strength   Right Hip Flexion  5/5    Right Hip ABduction  5/5    Left Hip Flexion  5/5    Left Hip ABduction  5/5      Palpation   Palpation comment  No innominate rotation noted today                   [facility] Adult PT Treatment/Exercise - 11/15/19 0001      Lumbar Exercises: Stretches   Passive Hamstring Stretch Limitations  different options/positions    Single Knee to Chest Stretch   Right;2 reps;20 seconds    Gastroc Stretch Limitations  standing lunge    Other Lumbar Stretch Exercise  Standing QL stretch      Manual Therapy   Soft tissue mobilization  Rt QL in Lt SL over pillow             PT Education - 11/15/19 0901    Education Details  goals, FOTO, importance of continued HEP    Person(s) Educated  Patient    Methods  Explanation;Demonstration    Comprehension  Verbalized understanding;Returned demonstration          PT Long Term Goals - 11/15/19 0839      PT LONG TERM GOAL #1   Title  Gross Le strength to 5/5    Status  Achieved      PT LONG TERM GOAL #2   Title  Pt will perform bed mobility without increase in LBP    Status  Achieved      PT LONG TERM GOAL #3   Title  Pt will be able to bend and stoop  to complete work activities    Status  Achieved      PT [place] #4   Title  able to demonstrate proper posture for biomechanical chain support    Baseline  leaning to Lt side with increased thoracic kyphosis resulting in neck pain    Status  Achieved            Plan - 11/15/19 0902    Clinical Impression Statement  Pt has met all of his goals and verbalized readiness for d/c to indpendent program. Recognizes that he has been limping due to pain in plantar aspect of foot and says he makes himself avoid limping to save his back. Encouraged him to contact us with any further questions.    PT Treatment/Interventions  ADLs/Self Care Home Management;Cryotherapy;Electrical Stimulation;Ultrasound;Traction;Moist Heat;Iontophoresis 66m/ml Dexamethasone;Functional mobility training;Therapeutic activities;Therapeutic exercise;Patient/family education;Neuromuscular re-education;Manual techniques;Passive range of motion;Dry needling;Spinal Manipulations;Taping;Joint Manipulations    PT Home Exercise Plan  LTR, hooklying ball squeeze, tennis ball STM, HSS, gastroc stretch, Lt sidelying over pillow    Consulted and Agree with Plan of Care   Patient       Patient will benefit from skilled therapeutic intervention in order to improve the following deficits and impairments:  Increased muscle spasms, Decreased activity tolerance, Pain, Improper body mechanics, Impaired flexibility, Decreased strength, Postural dysfunction  Visit Diagnosis: Chronic right-sided low back pain without sciatica  Muscle weakness (generalized)  Muscle spasm of back     Problem List Patient Active Problem List   Diagnosis Date Noted  . Back spasm 03/13/2019  . RBBB 09/14/2018  . Physical exam 09/06/2018  . Umbilical hernia 044/[id number]/4741 . Hyperlipidemia 02/08/2018  . HTN (hypertension) 02/08/2018  . Morbid [place] ([place] 02/08/2018  . Erectile dysfunction 02/08/2018  PHYSICAL THERAPY DISCHARGE SUMMARY  Visits from Start of Care: 3  Current functional level related to goals / functional outcomes: See above   Remaining deficits: See above   Education / Equipment: Anatomy of condition, POC, HEP, exercise form/rationale  Plan: Patient agrees to discharge.  Patient goals were met. Patient is being discharged due to meeting the stated rehab goals.  ?????      Makayla Lanter C. Bianna Haran PT, DPT 11/15/19 9:05 AM   [facility] [address]Phone: [phone]  Fax:  [phone] Name: Jason POORMANMRN: [id number] of Birth: [birth year]

## 2019-11-18 ENCOUNTER — Ambulatory Visit: Payer: 59 | Admitting: Physical Therapy

## 2019-11-20 ENCOUNTER — Ambulatory Visit: Payer: 59 | Admitting: Physical Therapy

## 2019-11-25 ENCOUNTER — Ambulatory Visit: Payer: 59 | Admitting: Physical Therapy

## 2019-11-27 ENCOUNTER — Ambulatory Visit: Payer: 59 | Admitting: Physical Therapy

## 2019-12-02 ENCOUNTER — Ambulatory Visit: Payer: 59 | Admitting: Physical Therapy

## 2019-12-04 ENCOUNTER — Encounter: Payer: 59 | Admitting: Physical Therapy

## 2019-12-09 ENCOUNTER — Encounter: Payer: 59 | Admitting: Physical Therapy

## 2019-12-11 ENCOUNTER — Encounter: Payer: 59 | Admitting: Physical Therapy

## 2019-12-16 ENCOUNTER — Other Ambulatory Visit: Payer: Self-pay | Admitting: General Practice

## 2019-12-16 MED ORDER — LISINOPRIL-HYDROCHLOROTHIAZIDE 20-12.5 MG PO TABS: 1.0000 | ORAL_TABLET | Freq: Every day | ORAL | 6 refills | Status: DC

## 2020-01-13 ENCOUNTER — Other Ambulatory Visit: Payer: Self-pay

## 2020-01-13 ENCOUNTER — Ambulatory Visit: Payer: 59 | Admitting: Podiatry

## 2020-01-13 ENCOUNTER — Ambulatory Visit: Payer: 59

## 2020-01-13 ENCOUNTER — Encounter: Payer: Self-pay | Admitting: Podiatry

## 2020-01-13 VITALS — Temp 98.0°F

## 2020-01-13 DIAGNOSIS — M79672 Pain in left foot: Secondary | ICD-10-CM

## 2020-01-13 DIAGNOSIS — L84 Corns and callosities: Secondary | ICD-10-CM | POA: Diagnosis not present

## 2020-01-13 DIAGNOSIS — M779 Enthesopathy, unspecified: Secondary | ICD-10-CM | POA: Diagnosis not present

## 2020-01-13 DIAGNOSIS — M21622 Bunionette of left foot: Secondary | ICD-10-CM | POA: Diagnosis not present

## 2020-01-13 NOTE — Progress Notes (Signed)
Subjective:   Patient ID: Jason Bartlett, male   DOB: 64 y.o.   MRN: [id number]   HPI Patient presents stating he is having quite a bit of pain in the outside of his left foot and states it sore when he walks and he had this about 3 years ago.  Patient's not been seen for over 3 years does not smoke likes to be active   Review of Systems  All other systems reviewed and are negative.       Objective:  Physical Exam Vitals and nursing note reviewed.  Constitutional:      Appearance: He is well-developed.  Pulmonary:     Effort: Pulmonary effort is normal.  Musculoskeletal:        General: Normal range of motion.  Skin:    General: Skin is warm.  Neurological:     Mental Status: He is alert.     Neurovascular status intact muscle strength found to be adequate range of motion within normal limits.  Patient is found to have exquisite discomfort fifth MPJ left with fluid buildup around the joint surface and lesion formation x3 that are painful with what appears to be lucent course     Assessment:  Inflammatory capsulitis fifth MPJ left with pain with lesion formation and tailor's bunion deformity     Plan:  H&P conditions and x-rays reviewed and today I went ahead did sterile prep and injected the fifth MPJ 3 mg dexamethasone Kenalog 5 mg Xylocaine and then using sterile instrumentation debrided 3 separate lesions with no iatrogenic bleeding.  Applied dressing instructed that we will see him back when it sore again and hopefully this will last for an extended period of time  X-rays indicate that the bone structure looks healthy with no indications of pathology currently

## 2020-04-09 ENCOUNTER — Ambulatory Visit (INDEPENDENT_AMBULATORY_CARE_PROVIDER_SITE_OTHER): Payer: 59

## 2020-04-09 ENCOUNTER — Other Ambulatory Visit: Payer: Self-pay

## 2020-04-09 ENCOUNTER — Ambulatory Visit: Payer: 59 | Admitting: Podiatry

## 2020-04-09 ENCOUNTER — Other Ambulatory Visit: Payer: Self-pay | Admitting: Podiatry

## 2020-04-09 ENCOUNTER — Encounter: Payer: Self-pay | Admitting: Podiatry

## 2020-04-09 VITALS — Temp 97.2°F

## 2020-04-09 DIAGNOSIS — Z472 Encounter for removal of internal fixation device: Secondary | ICD-10-CM | POA: Diagnosis not present

## 2020-04-09 DIAGNOSIS — M778 Other enthesopathies, not elsewhere classified: Secondary | ICD-10-CM

## 2020-04-09 DIAGNOSIS — M1000 Idiopathic gout, unspecified site: Secondary | ICD-10-CM

## 2020-04-09 NOTE — Patient Instructions (Signed)

## 2020-04-09 NOTE — Progress Notes (Signed)
Subjective:   Patient ID: Jason Bartlett, male   DOB: 64 y.o.   MRN: [id number]   HPI Patient presents stating the dorsal midfoot left has been very sore and has been hurting for around 4 days and I do not remember injury.  Patient states elevation deformity thing that is been helping him    ROS      Objective:  Physical Exam  Neurovascular status intact with patient found to have inflammation pain of the dorsal lateral midfoot with irritation of tissue in this area and is noted to have no other current pathology     Assessment:  Probability for acute gout attack along with extensor tendinitis inflammation     Plan:  H&P education concerning gout x-rays reviewed and today I did sterile prep and injected the dorsal tendon extensor complex 3 mg Kenalog 5 mg Xylocaine.  Discussed gout diet and gave him foods that I think he should avoid  X-rays were negative for signs of fracture or bony pathology associated with condition

## 2020-05-06 ENCOUNTER — Other Ambulatory Visit: Payer: Self-pay

## 2020-05-06 ENCOUNTER — Ambulatory Visit: Payer: 59 | Admitting: Podiatry

## 2020-05-06 ENCOUNTER — Encounter: Payer: Self-pay | Admitting: Podiatry

## 2020-05-06 VITALS — Temp 97.2°F

## 2020-05-06 DIAGNOSIS — M1000 Idiopathic gout, unspecified site: Secondary | ICD-10-CM | POA: Diagnosis not present

## 2020-05-06 DIAGNOSIS — M779 Enthesopathy, unspecified: Secondary | ICD-10-CM

## 2020-05-06 DIAGNOSIS — M778 Other enthesopathies, not elsewhere classified: Secondary | ICD-10-CM

## 2020-05-06 NOTE — Progress Notes (Signed)
Subjective:   Patient ID: Jason Bartlett, male   DOB: 64 y.o.   MRN: [id number]   HPI Patient presents stating my big toe joint left is inflamed and I think it is gout again and I would like to try to get this treated at this time.  I have tried to change diet but it seems like it just moves around   ROS      Objective:  Physical Exam  Neurovascular status intact with inflammation pain around the first MPJ left with fluid buildup with the midtarsal joint doing better     Assessment:  Inflammatory capsulitis first MPJ left with probability for gout     Plan:  H&P reviewed condition discussed gout and I have recommended at this time injection which was performed 3 mg Kenalog 5 g Xylocaine.  I then discussed further treatments for gout and we will start allopurinol and I am going to order blood work to rule out any systemic other issues that may be going on.  Patient will be seen back when we get results in the next 2 weeks and is encouraged to call with questions

## 2020-05-07 ENCOUNTER — Telehealth: Payer: Self-pay | Admitting: Podiatry

## 2020-05-07 MED ORDER — ALLOPURINOL 100 MG PO TABS
100.0000 mg | ORAL_TABLET | Freq: Every day | ORAL | 0 refills | Status: DC
Start: 2020-05-07 — End: 2020-08-19

## 2020-05-07 NOTE — Addendum Note (Signed)
Addended by: Harriett Sine D on: 05/07/2020 02:43 PM   Modules accepted: Orders

## 2020-05-07 NOTE — Telephone Encounter (Signed)
Called to check on the medi cation that Dr.Regal was gonna call in for gout, pt calling that medication has not been sent to pharmacy

## 2020-05-07 NOTE — Telephone Encounter (Signed)
Left message informing pt Dr. Paulla Dolly had ordered a medication for gout sent to [place].

## 2020-05-08 LAB — URIC ACID: Uric Acid, Serum: 8.2 mg/dL — ABNORMAL HIGH (ref 4.0–8.0)

## 2020-05-08 LAB — SEDIMENTATION RATE: Sed Rate: 29 mm/h — ABNORMAL HIGH (ref 0–20)

## 2020-05-08 LAB — C-REACTIVE PROTEIN: CRP: 13.7 mg/L — ABNORMAL HIGH (ref <8.0)

## 2020-05-08 LAB — ANA: Anti Nuclear Antibody (ANA): NEGATIVE

## 2020-05-08 LAB — RHEUMATOID FACTOR: Rheumatoid fact SerPl-aCnc: <14 IU/mL (ref <14)

## 2020-05-21 ENCOUNTER — Ambulatory Visit: Payer: 59 | Admitting: Podiatry

## 2020-05-21 ENCOUNTER — Other Ambulatory Visit: Payer: Self-pay

## 2020-05-21 DIAGNOSIS — M1000 Idiopathic gout, unspecified site: Secondary | ICD-10-CM | POA: Diagnosis not present

## 2020-05-21 DIAGNOSIS — M779 Enthesopathy, unspecified: Secondary | ICD-10-CM | POA: Diagnosis not present

## 2020-05-21 MED ORDER — COLCHICINE 0.6 MG PO TABS
0.6000 mg | ORAL_TABLET | Freq: Two times a day (BID) | ORAL | 2 refills | Status: DC
Start: 2020-05-21 — End: 2020-08-19

## 2020-05-21 MED ORDER — ALLOPURINOL 100 MG PO TABS: 100.0000 mg | ORAL_TABLET | Freq: Every day | ORAL | 6 refills | Status: DC

## 2020-05-22 NOTE — Progress Notes (Signed)
Subjective:   Patient ID: Jason Bartlett, male   DOB: 64 y.o.   MRN: [id number]   HPI Patient presents stating that his left is still bothering him some but somewhat better and now over the last couple days he start develop a lot of discomfort in his right big toe joint with inflammation feels like a gout attack is coming like to get it under control   ROS      Objective:  Physical Exam  Neurovascular status intact with elevated uric acid level sed rate C-reactive protein noted upon blood work with redness inflammation around the first MPJ right with moderate inflammation redness around the first MPJ left into the midfoot     Assessment:  Acute gout attack right first MPJ with blood work indicating gout as precipitating condition with capsulitis of the right first MPJ     Plan:  Reviewed capsulitis and went ahead did sterile prep and injected around the first MPJ right 3 mg Kenalog 5 mg Xylocaine then went ahead discussed gout and reviewed the blood work with patient.  I am starting him on allopurinol 100 mg daily along with colchicine to take twice daily when having an attack.  Patient will be seen back, also follow-up with family physician

## 2020-05-27 MED ORDER — METHYLPREDNISOLONE 4 MG PO TBPK
ORAL_TABLET | ORAL | 0 refills | Status: DC
Start: 2020-05-27 — End: 2020-08-19

## 2020-05-27 NOTE — Addendum Note (Signed)
Addended by: Celene Skeen A on: 05/27/2020 05:18 PM   Modules accepted: Orders

## 2020-08-17 ENCOUNTER — Telehealth: Payer: Self-pay | Admitting: Family Medicine

## 2020-08-17 NOTE — Telephone Encounter (Signed)
Made in error

## 2020-08-19 ENCOUNTER — Other Ambulatory Visit (INDEPENDENT_AMBULATORY_CARE_PROVIDER_SITE_OTHER): Payer: 59

## 2020-08-19 ENCOUNTER — Encounter: Payer: Self-pay | Admitting: Family Medicine

## 2020-08-19 ENCOUNTER — Ambulatory Visit: Payer: 59 | Admitting: Family Medicine

## 2020-08-19 ENCOUNTER — Other Ambulatory Visit: Payer: Self-pay

## 2020-08-19 VITALS — BP 134/82 | HR 97 | Temp 97.9°F | Resp 16 | Ht 70.0 in | Wt 260.5 lb

## 2020-08-19 DIAGNOSIS — N529 Male erectile dysfunction, unspecified: Secondary | ICD-10-CM | POA: Diagnosis not present

## 2020-08-19 DIAGNOSIS — E782 Mixed hyperlipidemia: Secondary | ICD-10-CM

## 2020-08-19 DIAGNOSIS — I10 Essential (primary) hypertension: Secondary | ICD-10-CM

## 2020-08-19 LAB — BASIC METABOLIC PANEL
BUN: 15 mg/dL (ref 6–23)
CO2: 25 mEq/L (ref 19–32)
Calcium: 9.5 mg/dL (ref 8.4–10.5)
Chloride: 100 mEq/L (ref 96–112)
Creatinine, Ser: 1.04 mg/dL (ref 0.40–1.50)
GFR: 71.33 mL/min (ref >60.00)
Glucose, Bld: 98 mg/dL (ref 70–99)
Potassium: 4.1 mEq/L (ref 3.5–5.1)
Sodium: 136 mEq/L (ref 135–145)

## 2020-08-19 LAB — CBC WITH DIFFERENTIAL/PLATELET
Basophils Absolute: 0.2 10*3/uL — ABNORMAL HIGH (ref 0.0–0.1)
Basophils Relative: 1.6 % (ref 0.0–3.0)
Eosinophils Absolute: 0.3 10*3/uL (ref 0.0–0.7)
Eosinophils Relative: 3.8 % (ref 0.0–5.0)
HCT: 40.4 % (ref 39.0–52.0)
Hemoglobin: 13.8 g/dL (ref 13.0–17.0)
Lymphocytes Relative: 17.7 % (ref 12.0–46.0)
Lymphs Abs: 1.6 10*3/uL (ref 0.7–4.0)
MCHC: 34 g/dL (ref 30.0–36.0)
MCV: 90.1 fl (ref 78.0–100.0)
Monocytes Absolute: 0.7 10*3/uL (ref 0.1–1.0)
Monocytes Relative: 7.2 % (ref 3.0–12.0)
Neutro Abs: 6.4 10*3/uL (ref 1.4–7.7)
Neutrophils Relative %: 69.7 % (ref 43.0–77.0)
Platelets: 199 10*3/uL (ref 150.0–400.0)
RBC: 4.48 Mil/uL (ref 4.22–5.81)
RDW: 13.9 % (ref 11.5–15.5)
WBC: 9.2 10*3/uL (ref 4.0–10.5)

## 2020-08-19 LAB — LIPID PANEL
Cholesterol: 169 mg/dL (ref 0–200)
HDL: 29.8 mg/dL — ABNORMAL LOW (ref >39.00)
LDL Cholesterol: 115 mg/dL — ABNORMAL HIGH (ref 0–99)
NonHDL: 138.78
Total CHOL/HDL Ratio: 6
Triglycerides: 120 mg/dL (ref 0.0–149.0)
VLDL: 24 mg/dL (ref 0.0–40.0)

## 2020-08-19 LAB — HEPATIC FUNCTION PANEL
ALT: 23 U/L (ref 0–53)
AST: 23 U/L (ref 0–37)
Albumin: 4.5 g/dL (ref 3.5–5.2)
Alkaline Phosphatase: 44 U/L (ref 39–117)
Bilirubin, Direct: 0.2 mg/dL (ref 0.0–0.3)
Total Bilirubin: 1.2 mg/dL (ref 0.2–1.2)
Total Protein: 8.1 g/dL (ref 6.0–8.3)

## 2020-08-19 LAB — TSH: TSH: 2.9 u[IU]/mL (ref 0.35–4.50)

## 2020-08-19 MED ORDER — LISINOPRIL-HYDROCHLOROTHIAZIDE 20-12.5 MG PO TABS: 1.0000 | ORAL_TABLET | Freq: Every day | ORAL | 1 refills | Status: DC

## 2020-08-19 MED ORDER — TADALAFIL 5 MG PO TABS: 5.0000 mg | ORAL_TABLET | Freq: Every day | ORAL | 3 refills | Status: DC

## 2020-08-19 NOTE — Assessment & Plan Note (Signed)
Chronic problem.  Attempting to control w/ diet and exercise but pt is not doing particularly well on either.  Check labs and determine if medication is needed.

## 2020-08-19 NOTE — Assessment & Plan Note (Signed)
Pt would like to switch from Viagra as needed to daily Cialis.  Prescription sent

## 2020-08-19 NOTE — Assessment & Plan Note (Signed)
Deteriorated.  Pt has gained 6 lbs since last visit.  Stressed need for healthy diet and regular exercise.  Will continue to follow.

## 2020-08-19 NOTE — Addendum Note (Signed)
Addended by: Fritz Pickerel on: 08/19/2020 01:40 PM   Modules accepted: Orders

## 2020-08-19 NOTE — Progress Notes (Signed)
   Subjective:    Patient ID: Jason Bartlett, male    DOB: 02-06-1956, 64 y.o.   MRN: [id number]  HPI HTN- chronic problem, on Lisinopril HCTZ 20/12.5mg  daily.  No CP, SOB, HAs, visual changes, edema  Hyperlipidemia- noted on last labs when LDL was 135.  Goal was to improve w/ healthy diet and regular exercise.  No abd pain, N/V  Morbid Obesity- pt has gained 6 lbs since last visit and BMI is now 37.38.  Given his other medical issues (HTN, hyperlipidemia) this qualifies as morbid obesity.  Pt reports he walks quite a bit at work but doesn't get his HR up.  ED- pt is asking to switch to once daily dosing rather than PRN   Review of Systems For ROS see HPI   This visit occurred during the SARS-CoV-2 public health emergency.  Safety protocols were in place, including screening questions prior to the visit, additional usage of staff PPE, and extensive cleaning of exam room while observing appropriate contact time as indicated for disinfecting solutions.       Objective:   Physical Exam Vitals reviewed.  Constitutional:      General: He is not in acute distress.    Appearance: He is well-developed. He is obese.  HENT:     Head: Normocephalic and atraumatic.  Eyes:     Conjunctiva/sclera: Conjunctivae normal.     Pupils: Pupils are equal, round, and reactive to light.  Neck:     Thyroid: No thyromegaly.  Cardiovascular:     Rate and Rhythm: Normal rate and regular rhythm.     Heart sounds: Normal heart sounds. No murmur heard.   Pulmonary:     Effort: Pulmonary effort is normal. No respiratory distress.     Breath sounds: Normal breath sounds.  Abdominal:     General: Bowel sounds are normal. There is no distension.     Palpations: Abdomen is soft.  Musculoskeletal:     Cervical back: Normal range of motion and neck supple.  Lymphadenopathy:     Cervical: No cervical adenopathy.  Skin:    General: Skin is warm and dry.  Neurological:     Mental Status: He is alert and  oriented to person, place, and time.     Cranial Nerves: No cranial nerve deficit.  Psychiatric:        Behavior: Behavior normal.           Assessment & Plan:

## 2020-08-19 NOTE — Assessment & Plan Note (Signed)
Chronic problem.  Adequate control today but not at goal.  Restart Lisinopril HCTZ as pt has run out.  Check labs.  Will continue to follow.

## 2020-08-19 NOTE — Patient Instructions (Signed)
Schedule your complete physical in 6 months We'll notify you of your lab results and make any changes if needed Continue to work on healthy diet and regular exercise- you can do it! STOP the Sildenafil START the once daily Tadalafil (Cialis) RESTART the Lisinopril HCTZ daily Call with any questions or concerns Stay Safe!  Stay Healthy!

## 2020-08-20 ENCOUNTER — Encounter: Payer: Self-pay | Admitting: General Practice

## 2020-10-26 ENCOUNTER — Other Ambulatory Visit: Payer: Self-pay

## 2020-10-26 DIAGNOSIS — C61 Malignant neoplasm of prostate: Secondary | ICD-10-CM

## 2020-10-30 ENCOUNTER — Other Ambulatory Visit: Payer: 59

## 2020-10-30 ENCOUNTER — Other Ambulatory Visit: Payer: Self-pay

## 2020-10-30 DIAGNOSIS — C61 Malignant neoplasm of prostate: Secondary | ICD-10-CM

## 2020-10-31 LAB — PSA, TOTAL AND FREE
PSA, Free Pct: 23.9 %
PSA, Free: 0.86 ng/mL
Prostate Specific Ag, Serum: 3.6 ng/mL (ref 0.0–4.0)

## 2020-11-04 ENCOUNTER — Other Ambulatory Visit: Payer: Self-pay

## 2020-11-04 ENCOUNTER — Encounter: Payer: Self-pay | Admitting: Urology

## 2020-11-04 ENCOUNTER — Ambulatory Visit (INDEPENDENT_AMBULATORY_CARE_PROVIDER_SITE_OTHER): Payer: 59 | Admitting: Urology

## 2020-11-04 VITALS — BP 145/82 | HR 87 | Temp 98.1°F | Ht 70.0 in | Wt 252.0 lb

## 2020-11-04 DIAGNOSIS — C61 Malignant neoplasm of prostate: Secondary | ICD-10-CM | POA: Insufficient documentation

## 2020-11-04 LAB — URINALYSIS, ROUTINE W REFLEX MICROSCOPIC
Appearance Ur: NEGATIVE
Bilirubin, UA: NEGATIVE
Color, UA: NEGATIVE
Glucose, UA: NEGATIVE
Ketones, UA: NEGATIVE
Leukocytes,UA: NEGATIVE
Nitrite, UA: NEGATIVE
Protein,UA: NEGATIVE
Specific Gravity, UA: 1.02 (ref 1.005–1.030)
Urobilinogen, Ur: 0.2 mg/dL (ref 0.2–1.0)
pH, UA: 6.5 (ref 5.0–7.5)

## 2020-11-04 LAB — MICROSCOPIC EXAMINATION
Bacteria, UA: NONE SEEN
Epithelial Cells (non renal): NONE SEEN /hpf (ref 0–10)
Renal Epithel, UA: NONE SEEN /hpf
WBC, UA: NONE SEEN /hpf (ref 0–5)

## 2020-11-04 MED ORDER — LEVOFLOXACIN 750 MG PO TABS: 750.0000 mg | ORAL_TABLET | Freq: Once | ORAL | 0 refills | Status: AC

## 2020-11-04 NOTE — Progress Notes (Signed)

## 2020-11-04 NOTE — Patient Instructions (Addendum)
Transrectal Ultrasound-Guided Prostate Biopsy This is a procedure to take samples of tissue from your prostate. Ultrasound images are used to guide the procedure. It is usually done to check for prostate cancer. What happens before the procedure? Staying hydrated Follow instructions about liquids. These may include:  Up to 2 hours before the procedure - you may drink clear liquids. This includes water, clear fruit juice, black coffee, and plain tea.  Eating and drinking restrictions Follow instructions about eating and drinking. These may include:  8 hours before the procedure - stop eating heavy meals or foods. This includes meat, fried foods, or fatty foods.  6 hours before the procedure - stop eating light meals or foods. This includes toast or cereal.  6 hours before the procedure - stop drinking milk or drinks that have milk.  2 hours before the procedure - stop drinking clear liquids. Medicines Ask your doctor about:  Changing or stopping your normal medicines. This is important if you take diabetes medicines or blood thinners.  Taking over-the-counter medicines, vitamins, herbs, and supplements.  Taking medicines such as aspirin and ibuprofen. These medicines can thin your blood. Do not take these medicines unless your doctor tells you to take them. General instructions  You may be given antibiotic medicine. If so, take it as told by your doctor.  Liquid will be used to clear waste from your butt (enema).  You may have a blood sample taken.  You may have a pee (urine) sample taken.  Plan to have someone take you home after your procedure. What happens during the procedure?   To lower your risk of infection: ? Your health care team will wash or sanitize their hands. ? Hair may be removed from the area. ? Your skin will be cleaned with soap. ? You will be given antibiotics.  An IV will be placed into one of your veins.  You will be given one or both of these: ? A  medicine to help you relax. ? A medicine to numb the area.  You will lie on your left side. Your knees will be bent.  A probe with gel on it will be placed in your butt. Pictures will be taken of your prostate and the area around it.  Medicine will be used to numb your prostate.  A needle will be placed in your butt and moved to your prostate.  Prostate tissue will be removed.  The samples will be sent to a lab. The procedure may vary. What happens after the procedure?  You will be watched until the medicines you were given have worn off.  You may have some pain in your butt. You will be given medicine for it. Summary  This procedure is usually done to check for prostate cancer.  Before the procedure, ask your doctor about changing or stopping your medicines.  You may have some pain in your butt. You will be given medicine for it.  Plan to have someone take you home after the procedure. This information is not intended to replace advice given to you by your health care provider. Make sure you discuss any questions you have with your health care provider. Document Revised: 04/03/2019 Document Reviewed: 03/10/2017 Elsevier Patient Education  [place].             Appointment Time: 12:30 Please arrive by 12:15 Appointment Date:  02/04/2020  Location: [facility]   Prostate Biopsy Instructions  Stop all aspirin or blood thinners (aspirin,  plavix, coumadin, warfarin, motrin, ibuprofen, advil, aleve, naproxen, naprosyn) for 7 days prior to the procedure.  If you have any questions about stopping these medications, please contact your primary care physician or cardiologist.  Having a light meal prior to the procedure is recommended.  If you are diabetic or have low blood sugar please bring a small snack or glucose tablet.  A Fleets enema is needed to be purchased over the counter at a local pharmacy and used 2 hours before you scheduled  appointment.  This can be purchased over the counter at any pharmacy.  Antibiotics will be administered in the clinic at the time of the procedure and 1 tablet has been sent to your pharmacy. Please take the antibiotic as prescribed.    Please bring someone with you to the procedure to drive you home if you are given a valium to take prior to your procedure.   If you have any questions or concerns, please feel free to call the office at [phone] or send a Mychart message.    Thank you, [facility]

## 2020-11-04 NOTE — Progress Notes (Signed)
11/04/2020 11:51 AM   Jason Bartlett January 30, 1956 [id number]  Referring provider: Midge Minium, MD [address] [place],  [place] [address]  followup prostate cancer  HPI: Jason Bartlett is a 89RD here for followup for prostate cancer. He is on active surveillance. PSA decreased to 3.6 from 3.9. No worsening LUTS. NO hematuria. Or dysuria  His records from [facility] are as follows: I have prostate cancer.  HPI: Jason Bartlett is a 64 year-old male established patient who is here evaluation for treatment of prostate cancer.  His prostate cancer was diagnosed 01/02/2020. He does have the pathology report from his biopsy. His cancer was diagnosed by Nicolette Bang. His PSA at his time of diagnosis was 9.77.   He has not undergone surgery for treatment. He has not undergone External Beam Radiation Therapy for treatment. He has not undergone Hormonal Therapy for treatment.   He does not have urinary incontinence. He does have problems with erectile dysfunction. He has not recently had unwanted weight loss. He is not having pain in new locations.   01/09/2020: Pathology revealed Gleason 3+3=6 in 2/12 cores 3-10% of core.   04/21/2020: PSa decreased to 3.9. No worsening LUTS.               PMH: Past Medical History:  Diagnosis Date  . GERD (gastroesophageal reflux disease)   . History of chicken pox   . Hyperlipidemia   . Hypertension     Surgical History: No past surgical history on file.  Home Medications:  Allergies as of 11/04/2020      Reactions   Penicillins Swelling   Has patient had a PCN reaction causing immediate rash, facial/tongue/throat swelling, SOB or lightheadedness with hypotension: Y Has patient had a PCN reaction causing severe rash involving mucus membranes or skin necrosis: Y Has patient had a PCN reaction that required hospitalization: N Has patient had a PCN reaction occurring within the last 10 years: N If all of the above answers are "NO",  then may proceed with Cephalosporin use.   Sulfa Antibiotics Swelling      Medication List       Accurate as of November 04, 2020 11:51 AM. If you have any questions, ask your nurse or doctor.        acetaminophen 500 MG tablet Commonly known as: TYLENOL Take 1,000 mg by mouth every 6 (six) hours as needed for moderate pain.   allopurinol 100 MG tablet Commonly known as: ZYLOPRIM Take 1 tablet (100 mg total) by mouth daily.   lisinopril-hydrochlorothiazide 20-12.5 MG tablet Commonly known as: ZESTORETIC Take 1 tablet by mouth daily.   methocarbamol 750 MG tablet Commonly known as: ROBAXIN TAKE 1 TABLET(750 MG) BY MOUTH EVERY 8 HOURS AS NEEDED FOR MUSCLE SPASMS   Mitigare 0.6 MG Caps Generic drug: Colchicine Take by mouth.   Multivitamin Adult Chew Chew 1 tablet by mouth daily.   tadalafil 5 MG tablet Commonly known as: CIALIS Take 1 tablet (5 mg total) by mouth daily.       Allergies:  Allergies  Allergen Reactions  . Penicillins Swelling    Has patient had a PCN reaction causing immediate rash, facial/tongue/throat swelling, SOB or lightheadedness with hypotension: Y Has patient had a PCN reaction causing severe rash involving mucus membranes or skin necrosis: Y Has patient had a PCN reaction that required hospitalization: N Has patient had a PCN reaction occurring within the last 10 years: N If all of the above answers  are "NO", then may proceed with Cephalosporin use.   . Sulfa Antibiotics Swelling    Family History: Family History  Problem Relation Age of Onset  . Cancer Mother        breast  . Dementia Father   . Hearing loss Father   . Cancer Father        bone cancer  . Hearing loss Brother   . Early death Paternal Grandfather   . Heart attack Paternal Grandfather     Social History:  reports that he has never smoked. He has never used smokeless tobacco. He reports current alcohol use. He reports that he does not use drugs.  ROS: All other  review of systems were reviewed and are negative except what is noted above in HPI  Physical Exam: BP (!) 145/82   Pulse 87   Temp 98.1 F (36.7 C)   Ht 5\' 10"  (1.778 m)   Wt 252 lb (114.3 kg)   BMI 36.16 kg/m   Constitutional:  Alert and oriented, No acute distress. HEENT: [place] AT, moist mucus membranes.  Trachea midline, no masses. Cardiovascular: No clubbing, cyanosis, or edema. Respiratory: Normal respiratory effort, no increased work of breathing. GI: Abdomen is soft, nontender, nondistended, no abdominal masses GU: No CVA tenderness.  Lymph: No cervical or inguinal lymphadenopathy. Skin: No rashes, bruises or suspicious lesions. Neurologic: Grossly intact, no focal deficits, moving all 4 extremities. Psychiatric: Normal mood and affect.  Laboratory Data: Lab Results  Component Value Date   WBC 9.2 08/19/2020   HGB 13.8 08/19/2020   HCT 40.4 08/19/2020   MCV 90.1 08/19/2020   PLT 199.0 08/19/2020    Lab Results  Component Value Date   CREATININE 1.04 08/19/2020    Lab Results  Component Value Date   PSA 9.77 (H) 10/15/2019   PSA 3.09 09/06/2018   PSA 2.9 02/08/2018    No results found for: TESTOSTERONE  Lab Results  Component Value Date   HGBA1C 6.4 10/15/2019    Urinalysis No results found for: COLORURINE, APPEARANCEUR, LABSPEC, PHURINE, GLUCOSEU, HGBUR, BILIRUBINUR, KETONESUR, PROTEINUR, UROBILINOGEN, NITRITE, LEUKOCYTESUR  No results found for: LABMICR, [place], RBCUA, LABEPIT, MUCUS, BACTERIA  Pertinent Imaging:  No results found for this or any previous visit.  No results found for this or any previous visit.  No results found for this or any previous visit.  No results found for this or any previous visit.  No results found for this or any previous visit.  No results found for this or any previous visit.  No results found for this or any previous visit.  No results found for this or any previous visit.   Assessment & Plan:    1.  Prostate cancer [facility]) The patient and I talked about etiologies of elevated PSA.  We discussed the possible relationship between elevated PSA, prostate cancer, BPH, prostatitis, and UTI.   Conservative treatment of elevated PSA with watchful waiting was discussed with the patient.  All questions were answered.        All of the risks and benefits along with alternatives to prostate biopsy were discussed with the patient.  The patient gave fully informed consent to proceed with a transrectal ultrasound guided biopsy of the prostate for the evaluation of their evated PSA.  Prostate biopsy instructions and antibiotics were given to the patient.  - Urinalysis, Routine w reflex microscopic   No follow-ups on file.  Nicolette Bang, MD  [facility] [place]

## 2020-11-10 NOTE — Progress Notes (Signed)
Results mailed 

## 2020-12-09 NOTE — Progress Notes (Signed)
Results mailed to  pt.

## 2021-01-21 ENCOUNTER — Telehealth: Payer: Self-pay

## 2021-01-21 NOTE — Telephone Encounter (Signed)
Patient called and notified of biopsy time change from 12:30 to 8am on same day. Voiced understanding.

## 2021-02-03 ENCOUNTER — Other Ambulatory Visit: Payer: Self-pay

## 2021-02-03 ENCOUNTER — Encounter (HOSPITAL_COMMUNITY): Payer: Self-pay

## 2021-02-03 ENCOUNTER — Ambulatory Visit (INDEPENDENT_AMBULATORY_CARE_PROVIDER_SITE_OTHER): Payer: 59 | Admitting: Urology

## 2021-02-03 ENCOUNTER — Ambulatory Visit (HOSPITAL_COMMUNITY)
Admission: RE | Admit: 2021-02-03 | Discharge: 2021-02-03 | Disposition: A | Discharge status: Home / Self Care | Payer: 59 | Source: Ambulatory Visit | Attending: Urology | Admitting: Urology

## 2021-02-03 ENCOUNTER — Other Ambulatory Visit: Payer: Self-pay | Admitting: Urology

## 2021-02-03 DIAGNOSIS — Z8546 Personal history of malignant neoplasm of prostate: Secondary | ICD-10-CM | POA: Diagnosis present

## 2021-02-03 DIAGNOSIS — C61 Malignant neoplasm of prostate: Secondary | ICD-10-CM

## 2021-02-03 MED ORDER — LIDOCAINE HCL (PF) 2 % IJ SOLN
INTRAMUSCULAR | Status: AC
  Administered 2021-02-03: 10 mL
  Filled 2021-02-03: qty 10

## 2021-02-03 MED ORDER — GENTAMICIN SULFATE 40 MG/ML IJ SOLN
INTRAMUSCULAR | Status: AC
  Administered 2021-02-03: 80 mg via INTRAMUSCULAR
  Filled 2021-02-03: qty 2

## 2021-02-03 MED ORDER — GENTAMICIN SULFATE 40 MG/ML IJ SOLN: 80.0000 mg | Freq: Once | INTRAMUSCULAR | Status: AC

## 2021-02-03 MED ORDER — LIDOCAINE HCL (PF) 2 % IJ SOLN: 10.0000 mL | Freq: Once | INTRAMUSCULAR | Status: AC

## 2021-02-03 NOTE — Sedation Documentation (Signed)
PT tolerated prostate biopsy procedure and IM antibiotic injection well today. Labs obtained and sent for pathology. PT ambulatory at discharge with no acute distress noted and verbalized understanding of discharge instructions. PT to follow up with urologist as scheduled on 02/17/21.

## 2021-02-03 NOTE — Discharge Instructions (Signed)

## 2021-02-04 ENCOUNTER — Encounter: Payer: Self-pay | Admitting: Urology

## 2021-02-04 NOTE — Patient Instructions (Signed)

## 2021-02-04 NOTE — Progress Notes (Signed)
Prostate Biopsy Procedure   Informed consent was obtained after discussing risks/benefits of the procedure.  A time out was performed to ensure correct patient identity.  Pre-Procedure: - Last PSA Level:  Lab Results  Component Value Date   PSA 9.77 (H) 10/15/2019   PSA 3.09 09/06/2018   PSA 2.9 02/08/2018   - Gentamicin given prophylactically - Levaquin 500 mg administered PO -Transrectal Ultrasound performed revealing a 41.3 gm prostate -No significant hypoechoic or median lobe noted  Procedure: - Prostate block performed using 10 cc 1% lidocaine and biopsies taken from sextant areas, a total of 12 under ultrasound guidance.  Post-Procedure: - Patient tolerated the procedure well - He was counseled to seek immediate medical attention if experiences any severe pain, significant bleeding, or fevers - Return in one week to discuss biopsy results

## 2021-02-07 ENCOUNTER — Encounter (HOSPITAL_BASED_OUTPATIENT_CLINIC_OR_DEPARTMENT_OTHER): Payer: Self-pay | Admitting: *Deleted

## 2021-02-07 ENCOUNTER — Emergency Department (HOSPITAL_BASED_OUTPATIENT_CLINIC_OR_DEPARTMENT_OTHER)
Admission: EM | Admit: 2021-02-07 | Discharge: 2021-02-07 | Disposition: A | Discharge status: Home / Self Care | Payer: 59 | Attending: Emergency Medicine | Admitting: Emergency Medicine

## 2021-02-07 ENCOUNTER — Other Ambulatory Visit: Payer: Self-pay

## 2021-02-07 ENCOUNTER — Emergency Department (HOSPITAL_BASED_OUTPATIENT_CLINIC_OR_DEPARTMENT_OTHER): Payer: 59

## 2021-02-07 DIAGNOSIS — Z20822 Contact with and (suspected) exposure to covid-19: Secondary | ICD-10-CM | POA: Diagnosis not present

## 2021-02-07 DIAGNOSIS — R109 Unspecified abdominal pain: Secondary | ICD-10-CM | POA: Insufficient documentation

## 2021-02-07 DIAGNOSIS — Z8546 Personal history of malignant neoplasm of prostate: Secondary | ICD-10-CM | POA: Diagnosis not present

## 2021-02-07 DIAGNOSIS — Z79899 Other long term (current) drug therapy: Secondary | ICD-10-CM | POA: Insufficient documentation

## 2021-02-07 DIAGNOSIS — R197 Diarrhea, unspecified: Secondary | ICD-10-CM | POA: Insufficient documentation

## 2021-02-07 DIAGNOSIS — R509 Fever, unspecified: Secondary | ICD-10-CM | POA: Insufficient documentation

## 2021-02-07 DIAGNOSIS — I10 Essential (primary) hypertension: Secondary | ICD-10-CM | POA: Insufficient documentation

## 2021-02-07 HISTORY — DX: Malignant neoplasm of prostate: C61

## 2021-02-07 LAB — CBC WITH DIFFERENTIAL/PLATELET
Abs Immature Granulocytes: 0.02 10*3/uL (ref 0.00–0.07)
Basophils Absolute: 0.1 10*3/uL (ref 0.0–0.1)
Basophils Relative: 1 %
Eosinophils Absolute: 0.1 10*3/uL (ref 0.0–0.5)
Eosinophils Relative: 1 %
HCT: 42.7 % (ref 39.0–52.0)
Hemoglobin: 14.3 g/dL (ref 13.0–17.0)
Immature Granulocytes: 0 %
Lymphocytes Relative: 8 %
Lymphs Abs: 0.8 10*3/uL (ref 0.7–4.0)
MCH: 30.4 pg (ref 26.0–34.0)
MCHC: 33.5 g/dL (ref 30.0–36.0)
MCV: 90.9 fL (ref 80.0–100.0)
Monocytes Absolute: 0.6 10*3/uL (ref 0.1–1.0)
Monocytes Relative: 6 %
Neutro Abs: 8.4 10*3/uL — ABNORMAL HIGH (ref 1.7–7.7)
Neutrophils Relative %: 84 %
Platelets: 186 10*3/uL (ref 150–400)
RBC: 4.7 MIL/uL (ref 4.22–5.81)
RDW: 13.4 % (ref 11.5–15.5)
WBC: 10 10*3/uL (ref 4.0–10.5)
nRBC: 0 % (ref 0.0–0.2)

## 2021-02-07 LAB — URINALYSIS, ROUTINE W REFLEX MICROSCOPIC
Bilirubin Urine: NEGATIVE
Glucose, UA: NEGATIVE mg/dL
Ketones, ur: NEGATIVE mg/dL
Leukocytes,Ua: NEGATIVE
Nitrite: NEGATIVE
Protein, ur: NEGATIVE mg/dL
Specific Gravity, Urine: 1.01 (ref 1.005–1.030)
pH: 6 (ref 5.0–8.0)

## 2021-02-07 LAB — COMPREHENSIVE METABOLIC PANEL
ALT: 25 U/L (ref 0–44)
AST: 25 U/L (ref 15–41)
Albumin: 3.9 g/dL (ref 3.5–5.0)
Alkaline Phosphatase: 42 U/L (ref 38–126)
Anion gap: 11 (ref 5–15)
BUN: 18 mg/dL (ref 8–23)
CO2: 22 mmol/L (ref 22–32)
Calcium: 8.3 mg/dL — ABNORMAL LOW (ref 8.9–10.3)
Chloride: 98 mmol/L (ref 98–111)
Creatinine, Ser: 1.14 mg/dL (ref 0.61–1.24)
GFR, Estimated: >60 mL/min (ref >60)
Glucose, Bld: 123 mg/dL — ABNORMAL HIGH (ref 70–99)
Potassium: 3.7 mmol/L (ref 3.5–5.1)
Sodium: 131 mmol/L — ABNORMAL LOW (ref 135–145)
Total Bilirubin: 1.2 mg/dL (ref 0.3–1.2)
Total Protein: 7.5 g/dL (ref 6.5–8.1)

## 2021-02-07 LAB — RESP PANEL BY RT-PCR (FLU A&B, COVID) ARPGX2
Influenza A by PCR: NEGATIVE
Influenza B by PCR: NEGATIVE
SARS Coronavirus 2 by RT PCR: NEGATIVE

## 2021-02-07 LAB — URINALYSIS, MICROSCOPIC (REFLEX)

## 2021-02-07 LAB — LACTIC ACID, PLASMA: Lactic Acid, Venous: 1.1 mmol/L (ref 0.5–1.9)

## 2021-02-07 MED ORDER — DOXYCYCLINE HYCLATE 100 MG PO CAPS: 100.0000 mg | ORAL_CAPSULE | Freq: Two times a day (BID) | ORAL | 0 refills | Status: AC

## 2021-02-07 MED ORDER — SODIUM CHLORIDE 0.9 % IV BOLUS
1000.0000 mL | Freq: Once | INTRAVENOUS | Status: AC
  Administered 2021-02-07: 1000 mL via INTRAVENOUS

## 2021-02-07 MED ORDER — GENTAMICIN SULFATE 40 MG/ML IJ SOLN
2.5000 mg/kg | Freq: Once | INTRAVENOUS | Status: AC
  Administered 2021-02-07: 280 mg via INTRAVENOUS
  Filled 2021-02-07: qty 7

## 2021-02-07 MED ORDER — IOHEXOL 300 MG/ML  SOLN
100.0000 mL | Freq: Once | INTRAMUSCULAR | Status: AC
  Administered 2021-02-07: 100 mL via INTRAVENOUS

## 2021-02-07 MED ORDER — GENTAMICIN SULFATE 40 MG/ML IJ SOLN
INTRAMUSCULAR | Status: AC
  Filled 2021-02-07: qty 8

## 2021-02-07 NOTE — ED Provider Notes (Signed)
Care assumed from [facility], [place], at shift change, please see their notes for full documentation of patient's complaint/HPI. Briefly, pt here with complaints of fever 102 F today. Results so far show reassuring work up; no leukocytosis; lactic acid within normal limits; U/A without infection, and rectal exam without signs of obvious infection. Awaiting CT A/P. Plan is to consult urology if no acute findings for further recommendations.   Physical Exam  BP 120/77 (BP Location: Right Arm)   Pulse 86   Temp 98.7 F (37.1 C) (Oral)   Resp 18   Ht 5\' 10"  (1.778 m)   Wt 113.4 kg   SpO2 95%   BMI 35.87 kg/m   Physical Exam Vitals and nursing note reviewed.  Constitutional:      Appearance: He is not ill-appearing.  HENT:     Head: Normocephalic and atraumatic.  Eyes:     Conjunctiva/sclera: Conjunctivae normal.  Cardiovascular:     Rate and Rhythm: Normal rate and regular rhythm.  Pulmonary:     Effort: Pulmonary effort is normal.     Breath sounds: Normal breath sounds.  Skin:    General: Skin is warm and dry.     Coloration: Skin is not jaundiced.  Neurological:     Mental Status: He is alert.     ED Course/Procedures     Procedures  Results for orders placed or performed during the hospital encounter of 02/07/21  Resp Panel by RT-PCR (Flu A&B, Covid) Nasopharyngeal Swab   Specimen: Nasopharyngeal Swab; Nasopharyngeal(NP) swabs in vial transport medium  Result Value Ref Range   SARS Coronavirus 2 by RT PCR NEGATIVE NEGATIVE   Influenza A by PCR NEGATIVE NEGATIVE   Influenza B by PCR NEGATIVE NEGATIVE  Lactic acid, plasma  Result Value Ref Range   Lactic Acid, Venous 1.1 0.5 - 1.9 mmol/L  Comprehensive metabolic panel  Result Value Ref Range   Sodium 131 (L) 135 - 145 mmol/L   Potassium 3.7 3.5 - 5.1 mmol/L   Chloride 98 98 - 111 mmol/L   CO2 22 22 - 32 mmol/L   Glucose, Bld 123 (H) 70 - 99 mg/dL   BUN 18 8 - 23 mg/dL   Creatinine, Ser 1.14 0.61 - 1.24 mg/dL    Calcium 8.3 (L) 8.9 - 10.3 mg/dL   Total Protein 7.5 6.5 - 8.1 g/dL   Albumin 3.9 3.5 - 5.0 g/dL   AST 25 15 - 41 U/L   ALT 25 0 - 44 U/L   Alkaline Phosphatase 42 38 - 126 U/L   Total Bilirubin 1.2 0.3 - 1.2 mg/dL   GFR, Estimated >60 >60 mL/min   Anion gap 11 5 - 15  CBC with Differential  Result Value Ref Range   WBC 10.0 4.0 - 10.5 K/uL   RBC 4.70 4.22 - 5.81 MIL/uL   Hemoglobin 14.3 13.0 - 17.0 g/dL   HCT 42.7 39.0 - 52.0 %   MCV 90.9 80.0 - 100.0 fL   MCH 30.4 26.0 - 34.0 pg   MCHC 33.5 30.0 - 36.0 g/dL   RDW 13.4 11.5 - 15.5 %   Platelets 186 150 - 400 K/uL   nRBC 0.0 0.0 - 0.2 %   Neutrophils Relative % 84 %   Neutro Abs 8.4 (H) 1.7 - 7.7 K/uL   Lymphocytes Relative 8 %   Lymphs Abs 0.8 0.7 - 4.0 K/uL   Monocytes Relative 6 %   Monocytes Absolute 0.6 0.1 - 1.0 K/uL  Eosinophils Relative 1 %   Eosinophils Absolute 0.1 0.0 - 0.5 K/uL   Basophils Relative 1 %   Basophils Absolute 0.1 0.0 - 0.1 K/uL   Immature Granulocytes 0 %   Abs Immature Granulocytes 0.02 0.00 - 0.07 K/uL  Urinalysis, Routine w reflex microscopic Urine, Clean Catch  Result Value Ref Range   Color, Urine YELLOW YELLOW   APPearance CLEAR CLEAR   Specific Gravity, Urine 1.010 1.005 - 1.030   pH 6.0 5.0 - 8.0   Glucose, UA NEGATIVE NEGATIVE mg/dL   Hgb urine dipstick LARGE (A) NEGATIVE   Bilirubin Urine NEGATIVE NEGATIVE   Ketones, ur NEGATIVE NEGATIVE mg/dL   Protein, ur NEGATIVE NEGATIVE mg/dL   Nitrite NEGATIVE NEGATIVE   Leukocytes,Ua NEGATIVE NEGATIVE  Urinalysis, Microscopic (reflex)  Result Value Ref Range   RBC / HPF 11-20 0 - 5 RBC/hpf   WBC, UA 0-5 0 - 5 WBC/hpf   Bacteria, UA RARE (A) NONE SEEN   Squamous Epithelial / LPF 0-5 0 - 5   CT Abdomen Pelvis W Contrast  Result Date: 02/07/2021 CLINICAL DATA:  Abdominal pain and fever. Prostate biopsy two days ago. EXAM: CT ABDOMEN AND PELVIS WITH CONTRAST TECHNIQUE: Multidetector CT imaging of the abdomen and pelvis was performed  using the standard protocol following bolus administration of intravenous contrast. CONTRAST:  168mL OMNIPAQUE IOHEXOL 300 MG/ML  SOLN COMPARISON:  None. FINDINGS: Lower chest: 3 mm subpleural nodule, left lower lobe, image 1, series 4. Minor atelectasis or scarring at the base of the left upper lobe lingula. Lung bases otherwise clear. Hepatobiliary: No focal liver abnormality is seen. No gallstones, gallbladder wall thickening, or biliary dilatation. Pancreas: Unremarkable. No pancreatic ductal dilatation or surrounding inflammatory changes. Spleen: Normal in size without focal abnormality. Adrenals/Urinary Tract: No adrenal masses. Kidneys normal in overall size, orientation and position. 12 mm low-density mass from the lower pole the right kidney consistent with a cyst. There are additional bilateral subcentimeter low-density renal lesions, too small to fully characterize, but also consistent with cysts. No stones. No hydronephrosis. Normal ureters. Normal bladder. Stomach/Bowel: Normal stomach. Small bowel and colon are normal in caliber. No wall thickening. No inflammation. No evidence of appendicitis. Vascular/Lymphatic: Aortic atherosclerosis. No aneurysm no enlarged lymph nodes. Reproductive: Enlarged prostate measuring 6.1 x 4.9 x 5.4 cm. Mild hazy opacity fat between the prostate, seminal vesicles and bladder base consistent with changes from the recent biopsy. Other: No abdominal wall hernia or abnormality. No abdominopelvic ascites. Musculoskeletal: No fracture or acute finding.  No bone lesion. IMPRESSION: 1. No acute findings. No findings to account for the patient's abdominal pain or fever. 2. Mild haziness in the fat between the prostate seminal vesicles and bladder base consistent with inflammation related to the recent prostate biopsy. Prostate enlargement. 3. Aortic atherosclerosis. Electronically Signed   By: Lajean Manes M.D.   On: 02/07/2021 20:20    MDM  CT scan without acute findings at  this time. Discussed case with urologist Dr. Jeffie Pollock - recommends providing IV gentamicin one time dose in the ED and discharge with doxycycline given pt's allergy to both PCN/sulfa. Will need to follow up with Dr. Alyson Ingles in the outpatient setting.   IV gentamicin provided. Discharged home with 7 days of doxycycline. Pt encouraged to call Dr. Noland Fordyce office on Monday to schedule a follow up appointment - he reports he has an appointment in 1.5 weeks; advised to discuss with urologist if he needs to come in sooner for further evaluation given fever.  Pt in agreement with plan and stable for discharge home.   This note was prepared using Dragon voice recognition software and may include unintentional dictation errors due to the inherent limitations of voice recognition software.       Eustaquio Maize, PA-C 02/07/21 2243    Charlesetta Shanks, MD 02/15/21 [phone]

## 2021-02-07 NOTE — ED Triage Notes (Signed)
Pt reports temp 102 today. He took 2 advil pta. He had a prostate biopsy on Friday. Denies cough/SOB

## 2021-02-07 NOTE — ED Provider Notes (Signed)
[facility] DEPART[facility] Provider Note   CSN: [id number] Arrival date & time: 02/07/21  1716     History Chief Complaint  Patient presents with  . Fever    DEMARQUS JOCSON is a 65 y.o. male past medical history of GERD, hypertension, hyperlipidemia, prostate cancer presents for evaluation of fever that began today.  He states he just got a prostate biopsy 4 days ago.  He states that this was done with urology and was told to monitor if he had any fever.  He states that he had been doing well until today when he had a fever of 102.  He took Advil prior to ED arrival.  He called the urologist and they recommended him being evaluated in the ED.  He reports that initially the day of the procedure, he had some pain around his rectum as well as some hematuria but states that that had improved.  This morning he had an episode of diarrhea associated with abdominal cramping.  He states that once he had the diarrhea, his abdominal cramping subsided he has not no longer had any abdominal pain.  He has been able to urinate without any difficulty denies any pain.  Has not had any swelling, redness of his testicles.  He denies any fevers, cough, congestion.  He is not any chest pain, difficulty breathing.  He states he has been vaccinated for COVID x2.  No Covid exposure that he knows of  The history is provided by the patient.       Past Medical History:  Diagnosis Date  . GERD (gastroesophageal reflux disease)   . History of chicken pox   . Hyperlipidemia   . Hypertension   . Prostate cancer [facility])     Patient Active Problem List   Diagnosis Date Noted  . Prostate cancer ([place]) 11/04/2020  . Back spasm 03/13/2019  . RBBB 09/14/2018  . Physical exam 09/06/2018  . Umbilical hernia 88/89/1694  . Hyperlipidemia 02/08/2018  . HTN (hypertension) 02/08/2018  . Morbid obesity ([place]) 02/08/2018  . Erectile dysfunction 02/08/2018    Past Surgical History:  Procedure Laterality  Date  . EXTERNAL EAR SURGERY    . PROSTATE SURGERY         Family History  Problem Relation Age of Onset  . Cancer Mother        breast  . Dementia Father   . Hearing loss Father   . Cancer Father        bone cancer  . Hearing loss Brother   . Early death Paternal Grandfather   . Heart attack Paternal Grandfather     Social History   Tobacco Use  . Smoking status: Never Smoker  . Smokeless tobacco: Never Used  Vaping Use  . Vaping Use: Never used  Substance Use Topics  . Alcohol use: Not Currently    Comment: social beer  . Drug use: No    Home Medications Prior to Admission medications   Medication Sig Start Date End Date Taking? Authorizing Provider  acetaminophen (TYLENOL) 500 MG tablet Take 1,000 mg by mouth every 6 (six) hours as needed for moderate pain.   Yes [provider]  allopurinol (ZYLOPRIM) 100 MG tablet Take 1 tablet (100 mg total) by mouth daily. 05/21/20   Wallene Huh, DPM  lisinopril-hydrochlorothiazide (ZESTORETIC) 20-12.5 MG tablet Take 1 tablet by mouth daily. Patient not taking: Reported on 11/04/2020 08/19/20   Midge Minium, MD  methocarbamol (ROBAXIN) 750 MG tablet  TAKE 1 TABLET(750 MG) BY MOUTH EVERY 8 HOURS AS NEEDED FOR MUSCLE SPASMS 10/15/19   Midge Minium, MD  MITIGARE 0.6 MG CAPS Take by mouth. 05/22/20   [provider]  Multiple Vitamins-Minerals (MULTIVITAMIN ADULT) CHEW Chew 1 tablet by mouth daily.     [provider]  tadalafil (CIALIS) 5 MG tablet Take 1 tablet (5 mg total) by mouth daily. 08/19/20   Midge Minium, MD    Allergies    Penicillins and Sulfa antibiotics  Review of Systems   Review of Systems  Constitutional: Positive for fever.  Respiratory: Negative for cough and shortness of breath.   Cardiovascular: Negative for chest pain.  Gastrointestinal: Negative for abdominal pain, nausea and vomiting.  Genitourinary: Negative for dysuria and hematuria.  Neurological:  Negative for headaches.  All other systems reviewed and are negative.   Physical Exam Updated Vital Signs BP 120/84 (BP Location: Right Arm)   Pulse 93   Temp 98.7 F (37.1 C) (Oral)   Resp 17   Ht 5\' 10"  (1.778 m)   Wt 113.4 kg   SpO2 96%   BMI 35.87 kg/m   Physical Exam Vitals and nursing note reviewed.  Constitutional:      Appearance: Normal appearance. He is well-developed and well-nourished.  HENT:     Head: Normocephalic and atraumatic.     Mouth/Throat:     Mouth: Oropharynx is clear and moist and mucous membranes are normal.  Eyes:     General: Lids are normal.     Extraocular Movements: EOM normal.     Conjunctiva/sclera: Conjunctivae normal.     Pupils: Pupils are equal, round, and reactive to light.  Cardiovascular:     Rate and Rhythm: Regular rhythm. Tachycardia present.     Pulses: Normal pulses.     Heart sounds: Normal heart sounds. No murmur heard. No friction rub. No gallop.   Pulmonary:     Effort: Pulmonary effort is normal.     Breath sounds: Normal breath sounds.     Comments: Lungs clear to auscultation bilaterally.  Symmetric chest rise.  No wheezing, rales, rhonchi. Abdominal:     Palpations: Abdomen is soft. Abdomen is not rigid.     Tenderness: There is no abdominal tenderness. There is no guarding.     Comments: Abdomen is soft, non-distended, non-tender. No rigidity, No guarding. No peritoneal signs.  Genitourinary:    Comments: The exam was performed with a chaperone present Marya Amsler, [place]). Normal male genitalia. No evidence of rash, ulcers or lesions.  No warmth, erythema, tenderness noted to the scrotum.  No pain to the perineal area.  Digital rectal exam shows no tenderness, no area of mass. Musculoskeletal:        General: Normal range of motion.     Cervical back: Full passive range of motion without pain.  Skin:    General: Skin is warm and dry.     Capillary Refill: Capillary refill takes less than 2 seconds.  Neurological:      Mental Status: He is alert and oriented to person, place, and time.  Psychiatric:        Mood and Affect: Mood and affect normal.        Speech: Speech normal.     ED Results / Procedures / Treatments   Labs (all labs ordered are listed, but only abnormal results are displayed) Labs Reviewed  COMPREHENSIVE METABOLIC PANEL - Abnormal; Notable for the following components:  Result Value   Sodium 131 (*)    Glucose, Bld 123 (*)    Calcium 8.3 (*)    All other components within normal limits  CBC WITH DIFFERENTIAL/PLATELET - Abnormal; Notable for the following components:   Neutro Abs 8.4 (*)    All other components within normal limits  URINALYSIS, ROUTINE W REFLEX MICROSCOPIC - Abnormal; Notable for the following components:   Hgb urine dipstick LARGE (*)    All other components within normal limits  URINALYSIS, MICROSCOPIC (REFLEX) - Abnormal; Notable for the following components:   Bacteria, UA RARE (*)    All other components within normal limits  RESP PANEL BY RT-PCR (FLU A&B, COVID) ARPGX2  LACTIC ACID, PLASMA  LACTIC ACID, PLASMA    EKG None  Radiology No results found.  Procedures Procedures   Medications Ordered in ED Medications  sodium chloride 0.9 % bolus 1,000 mL (1,000 mLs Intravenous New Bag/Given 02/07/21 1916)    ED Course  I have reviewed the triage vital signs and the nursing notes.  Pertinent labs & imaging results that were available during my care of the patient were reviewed by me and considered in my medical decision making (see chart for details).    MDM Rules/Calculators/A&P                          65 year old male who presents for evaluation of fever.  Reports that he had a recent prostate biopsy few days ago.  Started having fever today.  Called his urologist and was told to come to the emergency department for further evaluation/management.  On initial arrival, he is afebrile, nontoxic-appearing.  He is slightly tachycardic.  He does  endorse taking Advil prior to coming to the emergency department.  He has no abdominal tenderness.  No scrotal swelling, warmth, erythema.  No tenderness palpation of the perineum.  Rectal exam without any abnormalities.  We will plan to check labs, COVID.  Lactic is 1.1. CBC shows no leukocytosis or anemia. CMP is unremarkabke.  COVID is negative. UA shows hemoglobin.  No infectious etiology.  CMP is unremarkable.  Patient signed out to Eustaquio Maize, PA-C with CT abd/pelvis and Urololgy consult pending.   Portions of this note were generated with [employer]. Dictation errors may occur despite best attempts at proofreading.    Final Clinical Impression(s) / ED Diagnoses Final diagnoses:  Fever, unspecified fever cause    Rx / DC Orders ED Discharge Orders    None       Desma Mcgregor 02/07/21 Daphine Deutscher, MD 02/15/21 [phone]

## 2021-02-07 NOTE — ED Notes (Signed)
Patient informed that a urine sample is needed.

## 2021-02-07 NOTE — Discharge Instructions (Addendum)
Please pick up antibiotics and take as prescribed to cover for an infection given your recent febrile illness and prostate biopsy  Call Dr. Noland Fordyce office on Monday to schedule a follow up appointment. A secure message has been sent to him and he is aware of your workup in the ED today.   Return to the ED for any worsening symptoms

## 2021-02-07 NOTE — ED Notes (Addendum)
Assumed care of this patient. Patient reports fever with diarrhea. Denies pain at this time. Vitals taken. A&Ox4. Respirations regular/unlabored. Connected to bp and pulse ox. Stretcher low, wheels locked, call bell within reach. Family at bedside. Will continue to monitor.

## 2021-02-08 ENCOUNTER — Telehealth: Payer: Self-pay

## 2021-02-08 NOTE — Telephone Encounter (Signed)
Pts wife had called after hours saying pt was running a fever and had diarrhea. He had a biopsy done the prior Wednesday with Dr. Alyson Ingles. He had gone to the ER and was given antibiotics and a Ct scan. He said ER wanted him followed up this week. Pt has an appt 10 days out and Dr. Alyson Ingles said he would be fine to wait till then. Pt notified.

## 2021-02-09 ENCOUNTER — Telehealth: Payer: Self-pay

## 2021-02-09 NOTE — Telephone Encounter (Signed)
-----   Message from Cleon Gustin, MD sent at 02/09/2021 10:07 AM EST ----- Biopsy showed 1 area of abnormal cells but not cancer. He should see me in 3 months with PSA ----- Message ----- From: Dorisann Frames, RN Sent: 02/05/2021  10:51 AM EST To: Cleon Gustin, MD  Please review

## 2021-02-09 NOTE — Telephone Encounter (Signed)
Patient notified of biopsy results. New appts created and mailed to patient.

## 2021-02-10 ENCOUNTER — Ambulatory Visit: Payer: 59 | Admitting: Urology

## 2021-02-14 ENCOUNTER — Other Ambulatory Visit: Payer: Self-pay | Admitting: Family Medicine

## 2021-02-16 ENCOUNTER — Encounter: Payer: Self-pay | Admitting: Family Medicine

## 2021-02-16 ENCOUNTER — Ambulatory Visit (INDEPENDENT_AMBULATORY_CARE_PROVIDER_SITE_OTHER): Payer: 59 | Admitting: Family Medicine

## 2021-02-16 ENCOUNTER — Other Ambulatory Visit: Payer: Self-pay

## 2021-02-16 VITALS — BP 118/80 | HR 76 | Temp 98.1°F | Resp 19 | Ht 70.0 in | Wt 259.2 lb

## 2021-02-16 DIAGNOSIS — Z0000 Encounter for general adult medical examination without abnormal findings: Secondary | ICD-10-CM | POA: Diagnosis not present

## 2021-02-16 LAB — LIPID PANEL
Cholesterol: 143 mg/dL (ref 0–200)
HDL: 31.5 mg/dL — ABNORMAL LOW (ref >39.00)
LDL Cholesterol: 85 mg/dL (ref 0–99)
NonHDL: 111.79
Total CHOL/HDL Ratio: 5
Triglycerides: 136 mg/dL (ref 0.0–149.0)
VLDL: 27.2 mg/dL (ref 0.0–40.0)

## 2021-02-16 LAB — BASIC METABOLIC PANEL
BUN: 25 mg/dL — ABNORMAL HIGH (ref 6–23)
CO2: 27 mEq/L (ref 19–32)
Calcium: 9.8 mg/dL (ref 8.4–10.5)
Chloride: 98 mEq/L (ref 96–112)
Creatinine, Ser: 1.1 mg/dL (ref 0.40–1.50)
GFR: 69.68 mL/min (ref >60.00)
Glucose, Bld: 97 mg/dL (ref 70–99)
Potassium: 4.2 mEq/L (ref 3.5–5.1)
Sodium: 134 mEq/L — ABNORMAL LOW (ref 135–145)

## 2021-02-16 LAB — HEPATIC FUNCTION PANEL
ALT: 35 U/L (ref 0–53)
AST: 28 U/L (ref 0–37)
Albumin: 4.4 g/dL (ref 3.5–5.2)
Alkaline Phosphatase: 39 U/L (ref 39–117)
Bilirubin, Direct: 0.1 mg/dL (ref 0.0–0.3)
Total Bilirubin: 0.8 mg/dL (ref 0.2–1.2)
Total Protein: 8.2 g/dL (ref 6.0–8.3)

## 2021-02-16 LAB — CBC WITH DIFFERENTIAL/PLATELET
Basophils Absolute: 0.1 10*3/uL (ref 0.0–0.1)
Basophils Relative: 0.7 % (ref 0.0–3.0)
Eosinophils Absolute: 0.3 10*3/uL (ref 0.0–0.7)
Eosinophils Relative: 3.6 % (ref 0.0–5.0)
HCT: 42.9 % (ref 39.0–52.0)
Hemoglobin: 14.5 g/dL (ref 13.0–17.0)
Lymphocytes Relative: 21.2 % (ref 12.0–46.0)
Lymphs Abs: 1.8 10*3/uL (ref 0.7–4.0)
MCHC: 33.9 g/dL (ref 30.0–36.0)
MCV: 89.4 fl (ref 78.0–100.0)
Monocytes Absolute: 0.6 10*3/uL (ref 0.1–1.0)
Monocytes Relative: 6.7 % (ref 3.0–12.0)
Neutro Abs: 5.6 10*3/uL (ref 1.4–7.7)
Neutrophils Relative %: 67.8 % (ref 43.0–77.0)
Platelets: 217 10*3/uL (ref 150.0–400.0)
RBC: 4.79 Mil/uL (ref 4.22–5.81)
RDW: 14.5 % (ref 11.5–15.5)
WBC: 8.3 10*3/uL (ref 4.0–10.5)

## 2021-02-16 LAB — TSH: TSH: 3.47 u[IU]/mL (ref 0.35–4.50)

## 2021-02-16 NOTE — Progress Notes (Signed)
   Subjective:    Patient ID: Jason Bartlett, male    DOB: [birth year], 65 y.o.   MRN: [id number]  HPI CPE- UTD on colonoscopy, Tdap, flu, COVID.  Following w/ Dr Alyson Ingles for prostate cancer.  Pt got pneumonia vaccine last year at [facility] but isn't sure which one.  Reviewed past medical, surgical, family and social histories.   Health Maintenance  Topic Date Due  . PNA vac Low Risk Adult (1 of 2 - PCV13) Never done  . INFLUENZA VACCINE  03/25/2021 (Originally 07/26/2020)  . COVID-19 Vaccine (3 - Pfizer risk 4-dose series) 05/03/2021 (Originally 04/22/2020)  . Hepatitis C Screening  08/19/2021 (Originally [birth year])  . TETANUS/TDAP  02/09/2028  . COLONOSCOPY (Pts 45-24yrs Insurance coverage will need to be confirmed)  03/20/2028    Patient Care Team    Relationship Specialty Notifications Start End  Leonie Man, MD PCP - General Cardiology  02/16/21   Cleon Gustin, MD Consulting Physician Urology  02/16/21   Wallene Huh, DPM Consulting Physician Podiatry  02/16/21       Review of Systems Patient reports no vision/hearing changes, anorexia, adenopathy, persistant/recurrent hoarseness, swallowing issues, chest pain, palpitations, edema, persistant/recurrent cough, hemoptysis, dyspnea (rest,exertional, paroxysmal nocturnal), gastrointestinal  bleeding (melena, rectal bleeding), abdominal pain, excessive heart burn, GU symptoms (dysuria, hematuria, voiding/incontinence issues) syncope, focal weakness, memory loss, numbness & tingling, skin/hair/nail changes, depression, anxiety, abnormal bruising/bleeding, musculoskeletal symptoms/signs.   + fever after prostate biopsy on 2/13- no source identified  This visit occurred during the SARS-CoV-2 public health emergency.  Safety protocols were in place, including screening questions prior to the visit, additional usage of staff PPE, and extensive cleaning of exam room while observing appropriate contact time as indicated for  disinfecting solutions.       Objective:   Physical Exam General Appearance:    Alert, cooperative, no distress, appears stated age  Head:    Normocephalic, without obvious abnormality, atraumatic  Eyes:    PERRL, conjunctiva/corneas clear, EOM's intact, fundi    benign, both eyes       Ears:    Normal TM's and external ear canals, both ears  Nose:   Deferred due to COVID  Throat:   Neck:   Supple, symmetrical, trachea midline, no adenopathy;       thyroid:  No enlargement/tenderness/nodules  Back:     Symmetric, no curvature, ROM normal, no CVA tenderness  Lungs:     Clear to auscultation bilaterally, respirations unlabored  Chest wall:    No tenderness or deformity  Heart:    Regular rate and rhythm, S1 and S2 normal, no murmur, rub   or gallop  Abdomen:     Soft, non-tender, bowel sounds active all four quadrants,    no masses, no organomegaly  Genitalia:    Deferred to urology  Rectal:    Extremities:   Extremities normal, atraumatic, no cyanosis or edema  Pulses:   2+ and symmetric all extremities  Skin:   Skin color, texture, turgor normal, no rashes or lesions  Lymph nodes:   Cervical, supraclavicular, and axillary nodes normal  Neurologic:   CNII-XII intact. Normal strength, sensation and reflexes      throughout          Assessment & Plan:

## 2021-02-16 NOTE — Assessment & Plan Note (Signed)
Deteriorated.  Pt has gained 7 lbs since last visit.  BMI is now 37.19 and given that he has HTN and hyperlipidemia, this qualifies as morbidly obese.  Check labs to risk stratify.  Will follow.

## 2021-02-16 NOTE — Patient Instructions (Signed)
Follow up in 6 months to recheck BP, cholesterol, and weight loss progress We'll notify you of your lab results and make any changes if needed Continue to work on healthy diet and regular exercise- you can do it! Go to [facility] and ask for your 2nd pneumonia shot Please get your COVID booster! Call with any questions or concerns Stay Safe!  Stay Healthy!!

## 2021-02-16 NOTE — Assessment & Plan Note (Signed)
Pt's PE WNL w/ exception of obesity.  UTD on urology, colonoscopy, immunizations (had 1st pneumonia vaccine at [facility] but can't verify which on he got.  Will plan on getting 2nd there as well).  Check labs.  Anticipatory guidance provided.

## 2021-02-17 ENCOUNTER — Ambulatory Visit: Payer: 59 | Admitting: Urology

## 2021-05-03 ENCOUNTER — Other Ambulatory Visit: Payer: Self-pay

## 2021-05-03 ENCOUNTER — Other Ambulatory Visit: Payer: 59

## 2021-05-03 DIAGNOSIS — C61 Malignant neoplasm of prostate: Secondary | ICD-10-CM

## 2021-05-04 LAB — PSA: Prostate Specific Ag, Serum: 3.3 ng/mL (ref 0.0–4.0)

## 2021-05-06 NOTE — Progress Notes (Signed)
Sent via mail 

## 2021-05-12 ENCOUNTER — Other Ambulatory Visit: Payer: Self-pay

## 2021-05-12 ENCOUNTER — Ambulatory Visit (INDEPENDENT_AMBULATORY_CARE_PROVIDER_SITE_OTHER): Payer: 59 | Admitting: Urology

## 2021-05-12 ENCOUNTER — Encounter: Payer: Self-pay | Admitting: Urology

## 2021-05-12 VITALS — BP 117/76 | HR 105

## 2021-05-12 DIAGNOSIS — C61 Malignant neoplasm of prostate: Secondary | ICD-10-CM | POA: Diagnosis not present

## 2021-05-12 NOTE — Patient Instructions (Signed)
Prostate Cancer  The prostate is a male gland that helps make semen. It is located below a man's bladder, in front of the rectum. Prostate cancer is when abnormal cells grow in this gland. What are the causes? The cause of this condition is not known. What increases the risk? You are more likely to develop this condition if:  You are 67 years of age or older.  You are African American.  You have a family history of prostate cancer.  You have a family history of breast cancer. What are the signs or symptoms? Symptoms of this condition include:  A need to pee often.  Peeing that is weak, or pee that stops and starts.  Trouble starting or stopping your pee.  Inability to pee.  Blood in your pee or semen.  Pain in the lower back, lower belly (abdomen), hips, or upper thighs.  Trouble getting an erection.  Trouble emptying all of your pee. How is this treated? Treatment for this condition depends on your age, your health, the kind of treatment you like, and how far the cancer has spread. Treatments include:  Being watched. This is called observation. You will be tested from time to time, but you will not get treated. Tests are to make sure that the cancer is not growing.  Surgery. This may be done to remove the prostate, to remove the testicles, or to freeze or kill cancer cells.  Radiation. This uses a strong beam to kill cancer cells.  Ultrasound energy. This uses strong sound waves to kill cancer cells.  Chemotherapy. This uses medicines that stop cancer cells from increasing. This kills cancer cells and healthy cells.  Targeted therapy. This kills cancer cells only. Healthy cells are not affected.  Hormone treatment. This stops the body from making hormones that help the cancer cells to grow. Follow these instructions at home:  Take over-the-counter and prescription medicines only as told by your doctor.  Eat a healthy diet.  Get plenty of sleep.  Ask your  doctor for help to find a support group for men with prostate cancer.  If you have to go to the hospital, let your cancer doctor (oncologist) know.  Treatment may affect your ability to have sex. Touch, hold, hug, and caress your partner to have intimate moments.  Keep all follow-up visits as told by your doctor. This is important. Contact a doctor if:  You have new or more trouble peeing.  You have new or more blood in your pee.  You have new or more pain in your hips, back, or chest. Get help right away if:  You have weakness in your legs.  You lose feeling in your legs.  You cannot control your pee or your poop (stool).  You have chills or a fever. Summary  The prostate is a male gland that helps make semen.  Prostate cancer is when abnormal cells grow in this gland.  Treatment includes doing surgery, using medicines, using very strong beams, or watching without treatment.  Ask your doctor for help to find a support group for men with prostate cancer.  Contact a doctor if you have problems peeing or have any new pain that you did not have before. This information is not intended to replace advice given to you by your health care provider. Make sure you discuss any questions you have with your health care provider. Document Revised: 11/26/2019 Document Reviewed: 11/26/2019 Elsevier Patient Education  2021 Elsevier Inc.  

## 2021-05-12 NOTE — Progress Notes (Signed)
05/12/2021 3:57 PM   Jason Bartlett [birth year] [id number]  Referring provider: Midge Minium, MD [address] [place],  [place] [address]  followup Prostate cancer  HPI: Jason Bartlett is a 65yo here for followup for prostate cancer. PSA decreased to 3.3 from 3.6. He has stable ED on cialis 5mg . No other complaints today   PMH: Past Medical History:  Diagnosis Date  . GERD (gastroesophageal reflux disease)   . History of chicken pox   . Hyperlipidemia   . Hypertension   . Prostate cancer [facility])     Surgical History: Past Surgical History:  Procedure Laterality Date  . EXTERNAL EAR SURGERY    . PROSTATE SURGERY      Home Medications:  Allergies as of 05/12/2021      Reactions   Penicillins Swelling   Has patient had a PCN reaction causing immediate rash, facial/tongue/throat swelling, SOB or lightheadedness with hypotension: Y Has patient had a PCN reaction causing severe rash involving mucus membranes or skin necrosis: Y Has patient had a PCN reaction that required hospitalization: N Has patient had a PCN reaction occurring within the last 10 years: N If all of the above answers are "NO", then may proceed with Cephalosporin use.   Sulfa Antibiotics Swelling      Medication List       Accurate as of May 12, 2021  3:57 PM. If you have any questions, ask your nurse or doctor.        acetaminophen 500 MG tablet Commonly known as: TYLENOL Take 1,000 mg by mouth every 6 (six) hours as needed for moderate pain.   allopurinol 100 MG tablet Commonly known as: ZYLOPRIM Take 1 tablet (100 mg total) by mouth daily.   lisinopril-hydrochlorothiazide 20-12.5 MG tablet Commonly known as: ZESTORETIC TAKE 1 TABLET BY MOUTH DAILY   Multivitamin Adult Chew Chew 1 tablet by mouth daily.   tadalafil 5 MG tablet Commonly known as: CIALIS Take 1 tablet (5 mg total) by mouth daily.       Allergies:  Allergies  Allergen Reactions  . Penicillins Swelling     Has patient had a PCN reaction causing immediate rash, facial/tongue/throat swelling, SOB or lightheadedness with hypotension: Y Has patient had a PCN reaction causing severe rash involving mucus membranes or skin necrosis: Y Has patient had a PCN reaction that required hospitalization: N Has patient had a PCN reaction occurring within the last 10 years: N If all of the above answers are "NO", then may proceed with Cephalosporin use.   . Sulfa Antibiotics Swelling    Family History: Family History  Problem Relation Age of Onset  . Cancer Mother        breast  . Dementia Father   . Hearing loss Father   . Cancer Father        bone cancer  . Hearing loss Brother   . Early death Paternal Grandfather   . Heart attack Paternal Grandfather     Social History:  reports that he has never smoked. He has never used smokeless tobacco. He reports previous alcohol use. He reports that he does not use drugs.  ROS: All other review of systems were reviewed and are negative except what is noted above in HPI  Physical Exam: BP 117/76   Pulse (!) 105   Constitutional:  Alert and oriented, No acute distress. HEENT: [place] AT, moist mucus membranes.  Trachea midline, no masses. Cardiovascular: No clubbing, cyanosis, or edema. Respiratory:  Normal respiratory effort, no increased work of breathing. GI: Abdomen is soft, nontender, nondistended, no abdominal masses GU: No CVA tenderness.  Lymph: No cervical or inguinal lymphadenopathy. Skin: No rashes, bruises or suspicious lesions. Neurologic: Grossly intact, no focal deficits, moving all 4 extremities. Psychiatric: Normal mood and affect.  Laboratory Data: Lab Results  Component Value Date   WBC 8.3 02/16/2021   HGB 14.5 02/16/2021   HCT 42.9 02/16/2021   MCV 89.4 02/16/2021   PLT 217.0 02/16/2021    Lab Results  Component Value Date   CREATININE 1.10 02/16/2021    Lab Results  Component Value Date   PSA 9.77 (H) 10/15/2019   PSA  3.09 09/06/2018   PSA 2.9 02/08/2018    No results found for: TESTOSTERONE  Lab Results  Component Value Date   HGBA1C 6.4 10/15/2019    Urinalysis    Component Value Date/Time   COLORURINE YELLOW 02/07/2021 1848   APPEARANCEUR CLEAR 02/07/2021 1848   APPEARANCEUR Negative 11/04/2020 1131   LABSPEC 1.010 02/07/2021 1848   PHURINE 6.0 02/07/2021 1848   GLUCOSEU NEGATIVE 02/07/2021 1848   HGBUR LARGE (A) 02/07/2021 1848   BILIRUBINUR NEGATIVE 02/07/2021 1848   BILIRUBINUR Negative 11/04/2020 1131   Jason Bartlett 02/07/2021 1848   PROTEINUR NEGATIVE 02/07/2021 1848   NITRITE NEGATIVE 02/07/2021 1848   LEUKOCYTESUR NEGATIVE 02/07/2021 1848    Lab Results  Component Value Date   LABMICR See below: 11/04/2020   WBCUA None seen 11/04/2020   LABEPIT None seen 11/04/2020   BACTERIA RARE (A) 02/07/2021    Pertinent Imaging:  No results found for this or any previous visit.  No results found for this or any previous visit.  No results found for this or any previous visit.  No results found for this or any previous visit.  No results found for this or any previous visit.  No results found for this or any previous visit.  No results found for this or any previous visit.  No results found for this or any previous visit.   Assessment & Plan:    1. Prostate cancer ([place]) -Continue active surveillance -RTC 6 months with PSA - Urinalysis, Routine w reflex microscopic   No follow-ups on file.  Nicolette Bang, MD  [facility] [place]

## 2021-05-12 NOTE — Progress Notes (Signed)

## 2021-06-17 ENCOUNTER — Other Ambulatory Visit: Payer: Self-pay | Admitting: Podiatry

## 2021-06-21 NOTE — Telephone Encounter (Signed)
Please advise 

## 2021-06-29 ENCOUNTER — Other Ambulatory Visit: Payer: Self-pay

## 2021-06-29 ENCOUNTER — Telehealth: Payer: Self-pay | Admitting: Family Medicine

## 2021-06-29 ENCOUNTER — Telehealth (INDEPENDENT_AMBULATORY_CARE_PROVIDER_SITE_OTHER): Payer: 59 | Admitting: Registered Nurse

## 2021-06-29 ENCOUNTER — Encounter: Payer: Self-pay | Admitting: Registered Nurse

## 2021-06-29 DIAGNOSIS — U071 COVID-19: Secondary | ICD-10-CM | POA: Diagnosis not present

## 2021-06-29 MED ORDER — BENZONATATE 200 MG PO CAPS: 200.0000 mg | ORAL_CAPSULE | Freq: Two times a day (BID) | ORAL | 0 refills | Status: DC | PRN

## 2021-06-29 MED ORDER — DM-GUAIFENESIN ER 30-600 MG PO TB12: 1.0000 | ORAL_TABLET | Freq: Two times a day (BID) | ORAL | 0 refills | Status: DC

## 2021-06-29 MED ORDER — MOLNUPIRAVIR EUA 200MG CAPSULE
4.0000 | ORAL_CAPSULE | Freq: Two times a day (BID) | ORAL | 0 refills | Status: AC
Start: 2021-06-29 — End: 2021-07-04

## 2021-06-29 NOTE — Telephone Encounter (Signed)
Patient's spouse called to say that Jason Bartlett had called in a prescription for Mucinex DM and that they were not going to get it filled due to him having high blood pressure.  Patient's spouse was concerned because this was sent in.

## 2021-06-29 NOTE — Patient Instructions (Signed)
° ° ° °  If you have lab work done today you will be contacted with your lab results within the next 2 weeks.  If you have not heard from us then please contact us. The fastest way to get your results is to register for My Chart. ° ° °IF you received an x-ray today, you will receive an invoice from [facility]. Please contact [facility] at [phone] with questions or concerns regarding your invoice.  ° °IF you received labwork today, you will receive an invoice from [facility]. Please contact [facility] at [phone] with questions or concerns regarding your invoice.  ° °Our billing staff will not be able to assist you with questions regarding bills from these companies. ° °You will be contacted with the lab results as soon as they are available. The fastest way to get your results is to activate your My Chart account. Instructions are located on the last page of this paperwork. If you have not heard from us regarding the results in 2 weeks, please contact this office. °  ° ° ° °

## 2021-06-29 NOTE — Progress Notes (Signed)
Telemedicine Encounter- SOAP NOTE Established Patient  This telephone encounter was conducted with the patient's (or proxy's) verbal consent via audio telecommunications: yes/no: Yes Patient was instructed to have this encounter in a suitably private space; and to only have persons present to whom they give permission to participate. In addition, patient identity was confirmed by use of name plus two identifiers (DOB and address).  I discussed the limitations, risks, security and privacy concerns of performing an evaluation and management service by telephone and the availability of in person appointments. I also discussed with the patient that there may be a patient responsible charge related to this service. The patient expressed understanding and agreed to proceed.  I spent a total of 16 minutes talking with the patient or their proxy.  Patient at home Provider in office  Participants: Kathrin Ruddy, NP and [name]  Chief Complaint  Patient presents with   Covid Positive    Patient states he tested positive for covid yesterday. He started having symptoms of sore throat , loss of appetite, congestion , cough, and fever of 100.3. He states the tylenol has been helping some    Subjective   Jason Bartlett is a 65 y.o. established patient. Telephone visit today for COVID-19  HPI Tested positive on 06/28/21 Symptoms onset 06/27/21 at night- malaise, aches, fatigue, low grade temp Some cough/pnd, feeling of congestion in throat No shob, doe, chest pain, nvd  O2 is at 96-97%, stable  Has been taking tylenol around the clock, effective against aches and fever.  Has had two doses of vaccine, no booster No known sick contacts  Patient Active Problem List   Diagnosis Date Noted   Prostate cancer ([place]) 11/04/2020   Back spasm 03/13/2019   RBBB 09/14/2018   Physical exam 38/46/6599   Umbilical hernia 35/70/1779   Hyperlipidemia 02/08/2018   HTN (hypertension) 02/08/2018    Morbid obesity ([place]) 02/08/2018   Erectile dysfunction 02/08/2018    Past Medical History:  Diagnosis Date   GERD (gastroesophageal reflux disease)    History of chicken pox    Hyperlipidemia    Hypertension    Prostate cancer ([place])     Current Outpatient Medications  Medication Sig Dispense Refill   acetaminophen (TYLENOL) 500 MG tablet Take 1,000 mg by mouth every 6 (six) hours as needed for moderate pain.     allopurinol (ZYLOPRIM) 100 MG tablet TAKE 1 TABLET(100 MG) BY MOUTH DAILY 30 tablet 6   lisinopril-hydrochlorothiazide (ZESTORETIC) 20-12.5 MG tablet TAKE 1 TABLET BY MOUTH DAILY 90 tablet 1   Multiple Vitamins-Minerals (MULTIVITAMIN ADULT) CHEW Chew 1 tablet by mouth daily.      tadalafil (CIALIS) 5 MG tablet Take 1 tablet (5 mg total) by mouth daily. 30 tablet 3   No current facility-administered medications for this visit.    Allergies  Allergen Reactions   Penicillins Swelling    Has patient had a PCN reaction causing immediate rash, facial/tongue/throat swelling, SOB or lightheadedness with hypotension: Y Has patient had a PCN reaction causing severe rash involving mucus membranes or skin necrosis: Y Has patient had a PCN reaction that required hospitalization: N Has patient had a PCN reaction occurring within the last 10 years: N If all of the above answers are "NO", then may proceed with Cephalosporin use.    Sulfa Antibiotics Swelling    Social History   Socioeconomic History   Marital status: Married    Spouse name: Not on file   Number of  children: Not on file   Years of education: Not on file   Highest education level: Not on file  Occupational History    Employer: [employer]  Tobacco Use   Smoking status: Never   Smokeless tobacco: Never  Vaping Use   Vaping Use: Never used  Substance and Sexual Activity   Alcohol use: Not Currently    Comment: social beer   Drug use: No   Sexual activity: Yes  Other Topics Concern   Not on file   Social History Narrative   Not on file   Social Determinants of Health   Financial Resource Strain: Not on file  Food Insecurity: Not on file  Transportation Needs: Not on file  Physical Activity: Not on file  Stress: Not on file  Social Connections: Not on file  Intimate Partner Violence: Not on file    Review of Systems  Constitutional:  Positive for fever and malaise/fatigue. Negative for chills, diaphoresis and weight loss.  HENT:  Positive for congestion. Negative for ear discharge, ear pain, hearing loss, nosebleeds, sinus pain, sore throat and tinnitus.   Eyes: Negative.   Respiratory:  Positive for cough. Negative for hemoptysis, sputum production, shortness of breath, wheezing and stridor.   Cardiovascular: Negative.   Gastrointestinal: Negative.   Genitourinary: Negative.   Musculoskeletal: Negative.   Skin: Negative.   Neurological: Negative.   Endo/Heme/Allergies: Negative.   Psychiatric/Behavioral: Negative.    All other systems reviewed and are negative.  Objective   Vitals as reported by the patient: There were no vitals filed for this visit.  Jason Bartlett was seen today for covid positive.  Diagnoses and all orders for this visit:  COVID-19 -     molnupiravir EUA 200 mg CAPS; Take 4 capsules (800 mg total) by mouth 2 (two) times daily for 5 days. -     benzonatate (TESSALON) 200 MG capsule; Take 1 capsule (200 mg total) by mouth 2 (two) times daily as needed for cough. -     dextromethorphan-guaiFENesin (MUCINEX DM) 30-600 MG 12hr tablet; Take 1 tablet by mouth 2 (two) times daily.  PLAN Discussed risks and benefits of molnupiravir, will proceed. Tessalon and mucinex DM for ongoing symptom management Ok to continue around the clock tylenol at dose of 1000mg  PO q8h If symptoms persist or worsen, pt will call office or seek in person assessment Reviewed isolation guidelines with patient who voices understanding Patient encouraged to call clinic with any  questions, comments, or concerns.   I discussed the assessment and treatment plan with the patient. The patient was provided an opportunity to ask questions and all were answered. The patient agreed with the plan and demonstrated an understanding of the instructions.   The patient was advised to call back or seek an in-person evaluation if the symptoms worsen or if the condition fails to improve as anticipated.  I provided 16 minutes of non-face-to-face time during this encounter.  Maximiano Coss, NP  Primary Care at [facility]

## 2021-06-30 NOTE — Telephone Encounter (Signed)
Mucinex DM is ok to take w/ high blood pressure.  It is decongestants like Sudafed or Phenylephrine that we need to avoid with high blood pressure.  So it would be ok to to take the Mucinex DM as directed

## 2021-06-30 NOTE — Telephone Encounter (Signed)
Called patient with provider information. Patient voiced understanding.

## 2021-08-11 ENCOUNTER — Other Ambulatory Visit: Payer: Self-pay | Admitting: Family Medicine

## 2021-08-16 ENCOUNTER — Encounter: Payer: Self-pay | Admitting: Registered Nurse

## 2021-08-16 ENCOUNTER — Ambulatory Visit (INDEPENDENT_AMBULATORY_CARE_PROVIDER_SITE_OTHER): Payer: 59 | Admitting: Registered Nurse

## 2021-08-16 ENCOUNTER — Other Ambulatory Visit: Payer: Self-pay

## 2021-08-16 ENCOUNTER — Ambulatory Visit: Payer: 59 | Admitting: Family Medicine

## 2021-08-16 DIAGNOSIS — R202 Paresthesia of skin: Secondary | ICD-10-CM | POA: Diagnosis not present

## 2021-08-16 DIAGNOSIS — I10 Essential (primary) hypertension: Secondary | ICD-10-CM | POA: Diagnosis not present

## 2021-08-16 DIAGNOSIS — E782 Mixed hyperlipidemia: Secondary | ICD-10-CM

## 2021-08-16 MED ORDER — LISINOPRIL-HYDROCHLOROTHIAZIDE 20-12.5 MG PO TABS: 1.0000 | ORAL_TABLET | Freq: Every day | ORAL | 1 refills | Status: DC

## 2021-08-16 NOTE — Patient Instructions (Addendum)
Jason Bartlett -   Doristine Devoid blood pressure today, if labs look like they did last time, those will be great too  Definitely tough to keep a healthy diet and stay active, particularly through times of stress.  Cough: likely just post covid. Should clear up in another few weeks. Can do OTC cough suppressant. May have a reflux aspect - double up on omeprazole for a total of '40mg'$  a day for the next week. Call if not improving after 3-4 weeks.  Numbness: likely just some nerve interruption, but will check sugars, Vit D, Vit B12 today on labs. I'll let you know if we need to do anything about results.  Remember - on the airplane, put on your own mask before helping others with theirs. Self care is imperative.  Diet: eat real food, not too much, mostly plants. Check out [employer] for more on Americans' relationship with food Exercise: 30 minutes each day at least 5 days a week. Anything that gets your heart beating a little faster than it does at rest constitutes exercise.  Thank you, I'll be in touch with lab results  Rich

## 2021-08-16 NOTE — Progress Notes (Signed)
Established Patient Office Visit  Subjective:  Patient ID: Jason Bartlett, male    DOB: 1956/02/10  Age: 65 y.o. MRN: [id number]  CC:  Chief Complaint  Patient presents with   Hypertension   Hyperlipidemia   Obesity    Pt says that he has been terrible with trying to lose weight, but he is trying to eat better. He's also a lot more stressed due to his Father's health.     HPI Jason Bartlett presents for 6 mo follow up  Hypertension: Patient Currently taking: lisinopril-hctz 20-12.'5mg'$  po qd Good effect. No AEs. Denies CV symptoms including: chest pain, shob, doe, headache, visual changes, fatigue, claudication, and dependent edema.   Previous readings and labs: BP Readings from Last 3 Encounters:  08/16/21 112/60  05/12/21 117/76  02/16/21 118/80   Lab Results  Component Value Date   CREATININE 1.10 02/16/2021     Hld Lab Results  Component Value Date   CHOL 143 02/16/2021   CHOL 169 08/19/2020   CHOL 202 (H) 10/15/2019   Lab Results  Component Value Date   HDL 31.50 (L) 02/16/2021   HDL 29.80 (L) 08/19/2020   HDL 30.40 (L) 10/15/2019   Lab Results  Component Value Date   LDLCALC 85 02/16/2021   LDLCALC 115 (H) 08/19/2020   LDLCALC 135 (H) 10/15/2019   Lab Results  Component Value Date   TRIG 136.0 02/16/2021   TRIG 120.0 08/19/2020   TRIG 184.0 (H) 10/15/2019   Lab Results  Component Value Date   CHOLHDL 5 02/16/2021   CHOLHDL 6 08/19/2020   CHOLHDL 7 10/15/2019   No results found for: LDLDIRECT  Well controlled with diet and exercise.  Obesity Admits diet and exercise not priorities at this time Unfortunately his father is in poor health and he has been preoccupied. Endorses that he plans to continue to try to improve his diet.  Cough Lingering since covid Improving Not productive Worse when lying down at night No other symptoms  Tingling In both feet Not numbness No acuity or trauma related Borderline sugars in past but  not diabetic No hx of vitamin deficiencies  Past Medical History:  Diagnosis Date   GERD (gastroesophageal reflux disease)    History of chicken pox    Hyperlipidemia    Hypertension    Prostate cancer ([place])     Past Surgical History:  Procedure Laterality Date   EXTERNAL EAR SURGERY     PROSTATE SURGERY      Family History  Problem Relation Age of Onset   Cancer Mother        breast   Dementia Father    Hearing loss Father    Cancer Father        bone cancer   Hearing loss Brother    Early death Paternal Grandfather    Heart attack Paternal Grandfather     Social History   Socioeconomic History   Marital status: Married    Spouse name: Not on file   Number of children: Not on file   Years of education: Not on file   Highest education level: Not on file  Occupational History    Employer: [employer]  Tobacco Use   Smoking status: Never   Smokeless tobacco: Never  Vaping Use   Vaping Use: Never used  Substance and Sexual Activity   Alcohol use: Not Currently    Comment: social beer   Drug use: No   Sexual activity: Yes  Other Topics Concern   Not on file  Social History Narrative   Not on file   Social Determinants of Health   Financial Resource Strain: Not on file  Food Insecurity: Not on file  Transportation Needs: Not on file  Physical Activity: Not on file  Stress: Not on file  Social Connections: Not on file  Intimate Partner Violence: Not on file    Outpatient Medications Prior to Visit  Medication Sig Dispense Refill   acetaminophen (TYLENOL) 500 MG tablet Take 1,000 mg by mouth every 6 (six) hours as needed for moderate pain.     allopurinol (ZYLOPRIM) 100 MG tablet TAKE 1 TABLET(100 MG) BY MOUTH DAILY 30 tablet 6   lisinopril-hydrochlorothiazide (ZESTORETIC) 20-12.5 MG tablet TAKE 1 TABLET BY MOUTH DAILY 90 tablet 1   Multiple Vitamins-Minerals (MULTIVITAMIN ADULT) CHEW Chew 1 tablet by mouth daily.      tadalafil (CIALIS) 5 MG  tablet Take 1 tablet (5 mg total) by mouth daily. 30 tablet 3   benzonatate (TESSALON) 200 MG capsule Take 1 capsule (200 mg total) by mouth 2 (two) times daily as needed for cough. (Patient not taking: Reported on 08/16/2021) 20 capsule 0   dextromethorphan-guaiFENesin (MUCINEX DM) 30-600 MG 12hr tablet Take 1 tablet by mouth 2 (two) times daily. (Patient not taking: Reported on 08/16/2021) 20 tablet 0   No facility-administered medications prior to visit.    Allergies  Allergen Reactions   Penicillins Swelling    Has patient had a PCN reaction causing immediate rash, facial/tongue/throat swelling, SOB or lightheadedness with hypotension: Y Has patient had a PCN reaction causing severe rash involving mucus membranes or skin necrosis: Y Has patient had a PCN reaction that required hospitalization: N Has patient had a PCN reaction occurring within the last 10 years: N If all of the above answers are "NO", then may proceed with Cephalosporin use.    Sulfa Antibiotics Swelling    ROS Review of Systems  Constitutional: Negative.   HENT: Negative.    Eyes: Negative.   Respiratory: Negative.    Cardiovascular: Negative.   Gastrointestinal: Negative.   Genitourinary: Negative.   Musculoskeletal: Negative.   Skin: Negative.   Neurological: Negative.   Psychiatric/Behavioral: Negative.    All other systems reviewed and are negative.    Objective:    Physical Exam Constitutional:      General: He is not in acute distress.    Appearance: Normal appearance. He is normal weight. He is not ill-appearing, toxic-appearing or diaphoretic.  Cardiovascular:     Rate and Rhythm: Normal rate and regular rhythm.     Heart sounds: Normal heart sounds. No murmur heard.   No friction rub. No gallop.  Pulmonary:     Effort: Pulmonary effort is normal. No respiratory distress.     Breath sounds: Normal breath sounds. No stridor. No wheezing, rhonchi or rales.  Chest:     Chest wall: No  tenderness.  Neurological:     General: No focal deficit present.     Mental Status: He is alert and oriented to person, place, and time. Mental status is at baseline.  Psychiatric:        Mood and Affect: Mood normal.        Behavior: Behavior normal.        Thought Content: Thought content normal.        Judgment: Judgment normal.    BP 112/60   Pulse (!) 104   Temp 98.3 F (36.8 C) (Temporal)  Ht '5\' 10"'$  (1.778 m)   Wt 259 lb 9.6 oz (117.8 kg)   SpO2 93%   BMI 37.25 kg/m  Wt Readings from Last 3 Encounters:  08/16/21 259 lb 9.6 oz (117.8 kg)  02/16/21 259 lb 3.2 oz (117.6 kg)  02/07/21 250 lb (113.4 kg)     Health Maintenance Due  Topic Date Due   INFLUENZA VACCINE  07/26/2021    There are no preventive care reminders to display for this patient.  Lab Results  Component Value Date   TSH 3.47 02/16/2021   Lab Results  Component Value Date   WBC 8.3 02/16/2021   HGB 14.5 02/16/2021   HCT 42.9 02/16/2021   MCV 89.4 02/16/2021   PLT 217.0 02/16/2021   Lab Results  Component Value Date   NA 134 (L) 02/16/2021   K 4.2 02/16/2021   CO2 27 02/16/2021   GLUCOSE 97 02/16/2021   BUN 25 (H) 02/16/2021   CREATININE 1.10 02/16/2021   BILITOT 0.8 02/16/2021   ALKPHOS 39 02/16/2021   AST 28 02/16/2021   ALT 35 02/16/2021   PROT 8.2 02/16/2021   ALBUMIN 4.4 02/16/2021   CALCIUM 9.8 02/16/2021   ANIONGAP 11 02/07/2021   GFR 69.68 02/16/2021   Lab Results  Component Value Date   CHOL 143 02/16/2021   Lab Results  Component Value Date   HDL 31.50 (L) 02/16/2021   Lab Results  Component Value Date   LDLCALC 85 02/16/2021   Lab Results  Component Value Date   TRIG 136.0 02/16/2021   Lab Results  Component Value Date   CHOLHDL 5 02/16/2021   Lab Results  Component Value Date   HGBA1C 6.4 10/15/2019      Assessment & Plan:   Problem List Items Addressed This Visit       Other   Hyperlipidemia (Chronic)   Morbid obesity ([place]) - Primary  (Chronic)   Other Visit Diagnoses     Essential hypertension           No orders of the defined types were placed in this encounter.   Follow-up: No follow-ups on file.   PLAN BP well controlled, continue current regimen Labs collected. Will follow up with the patient as warranted. Counseled on diet and exercise Patient encouraged to call clinic with any questions, comments, or concerns.  Maximiano Coss, NP

## 2021-08-17 LAB — COMPREHENSIVE METABOLIC PANEL
ALT: 24 U/L (ref 0–53)
AST: 25 U/L (ref 0–37)
Albumin: 4.2 g/dL (ref 3.5–5.2)
Alkaline Phosphatase: 40 U/L (ref 39–117)
BUN: 17 mg/dL (ref 6–23)
CO2: 25 mEq/L (ref 19–32)
Calcium: 9.3 mg/dL (ref 8.4–10.5)
Chloride: 102 mEq/L (ref 96–112)
Creatinine, Ser: 1.28 mg/dL (ref 0.40–1.50)
GFR: 57.89 mL/min — ABNORMAL LOW (ref >60.00)
Glucose, Bld: 107 mg/dL — ABNORMAL HIGH (ref 70–99)
Potassium: 4.1 mEq/L (ref 3.5–5.1)
Sodium: 137 mEq/L (ref 135–145)
Total Bilirubin: 0.6 mg/dL (ref 0.2–1.2)
Total Protein: 7.4 g/dL (ref 6.0–8.3)

## 2021-08-17 LAB — LIPID PANEL
Cholesterol: 161 mg/dL (ref 0–200)
HDL: 28.2 mg/dL — ABNORMAL LOW (ref >39.00)
LDL Cholesterol: 98 mg/dL (ref 0–99)
NonHDL: 133.23
Total CHOL/HDL Ratio: 6
Triglycerides: 175 mg/dL — ABNORMAL HIGH (ref 0.0–149.0)
VLDL: 35 mg/dL (ref 0.0–40.0)

## 2021-08-17 LAB — VITAMIN D 25 HYDROXY (VIT D DEFICIENCY, FRACTURES): VITD: 28.68 ng/mL — ABNORMAL LOW (ref 30.00–100.00)

## 2021-08-17 LAB — HEMOGLOBIN A1C: Hgb A1c MFr Bld: 6.4 % (ref 4.6–6.5)

## 2021-08-17 LAB — VITAMIN B12: Vitamin B-12: 723 pg/mL (ref 211–911)

## 2021-08-24 ENCOUNTER — Encounter: Payer: Self-pay | Admitting: Registered Nurse

## 2021-10-02 IMAGING — CT CT ABD-PELV W/ CM
2 of 5 series · 16 of 46 positions shown, 18 images · IV contrast (Omnipaque)
Comparison: None.

CLINICAL DATA: Abdominal pain and fever. Prostate biopsy two days
ago.

EXAM:
CT ABDOMEN AND PELVIS WITH CONTRAST
TECHNIQUE: Multidetector CT imaging of the abdomen and pelvis was performed
using the standard protocol following bolus administration of
intravenous contrast.
CONTRAST:  100mL OMNIPAQUE IOHEXOL 300 MG/ML  SOLN

[Series 2: axial st · axial · 0.94mm/px · z∈[+367,+822]mm · 13 of 103 slices shown, 15 images]
[im 6/103  soft-tissue]
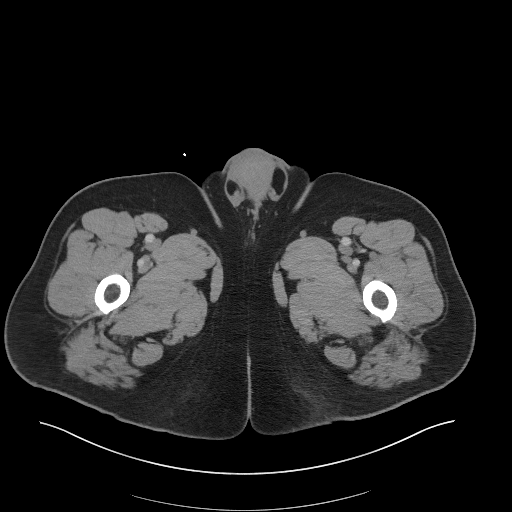
[im 6/103  bone]
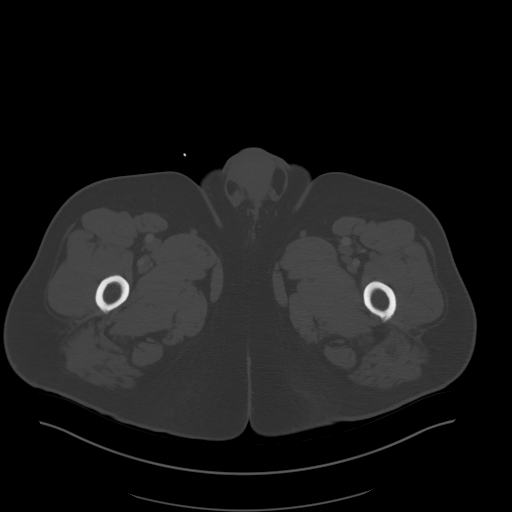
[im 17/103  soft-tissue]
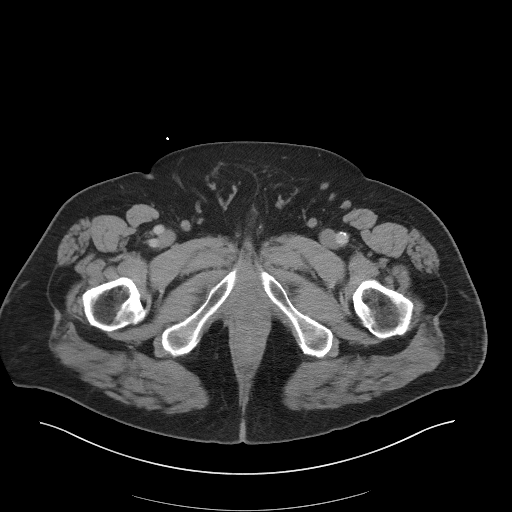
[im 22/103  soft-tissue]
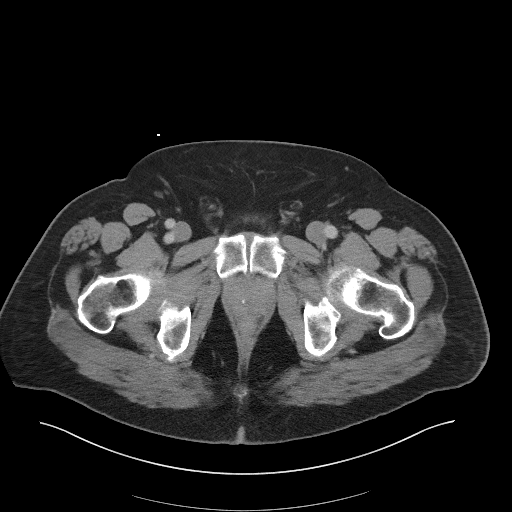
[im 27/103  soft-tissue]
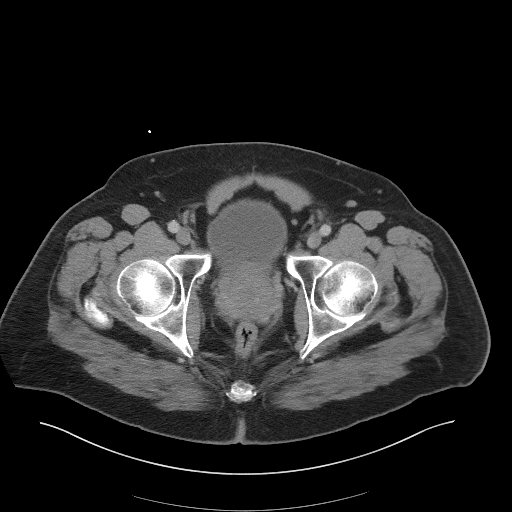
[im 38/103  soft-tissue]
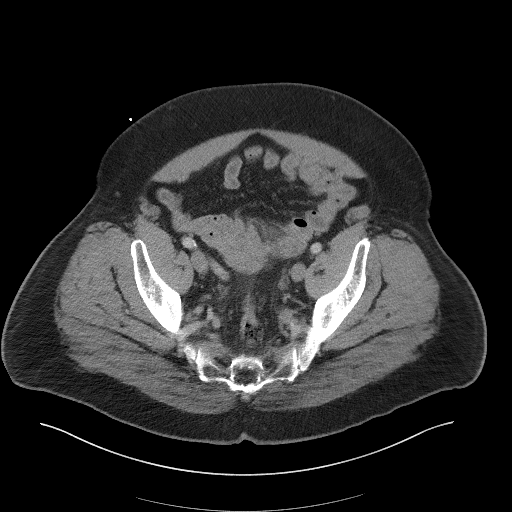
[im 43/103  soft-tissue]
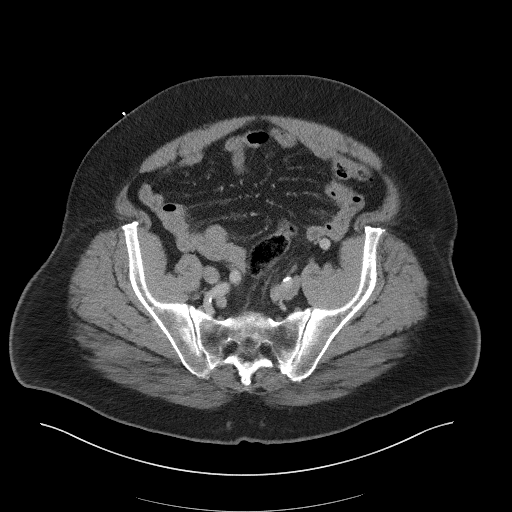
[im 54/103  soft-tissue]
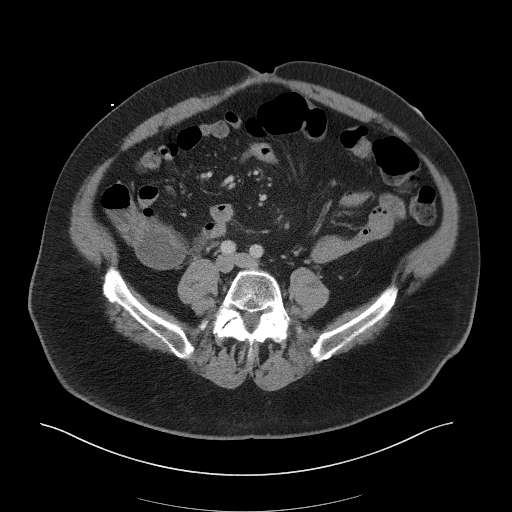
[im 60/103  soft-tissue]
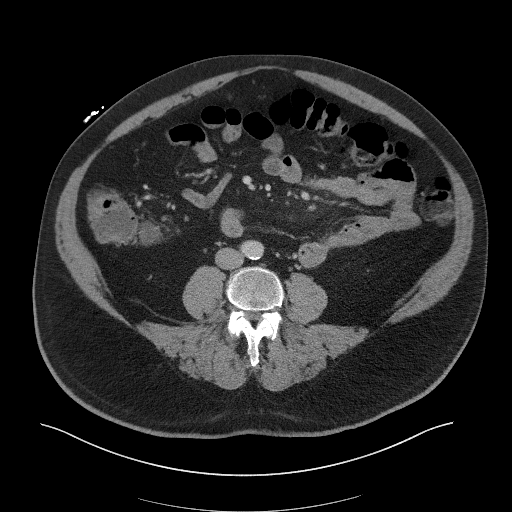
[im 65/103  soft-tissue]
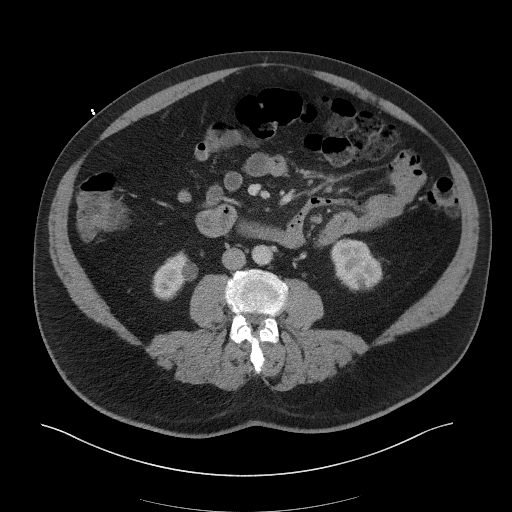
[im 65/103  bone]
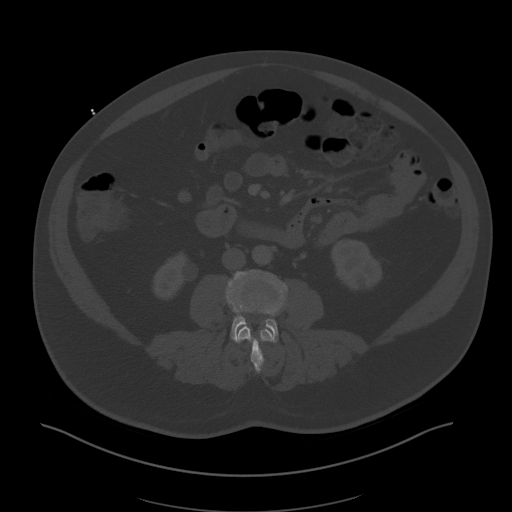
[im 76/103  soft-tissue]
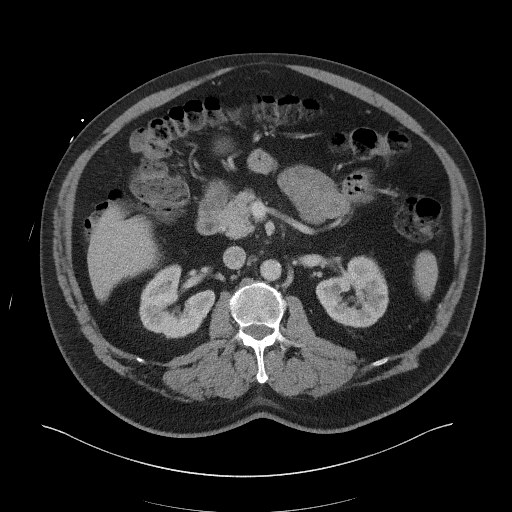
[im 81/103  soft-tissue]
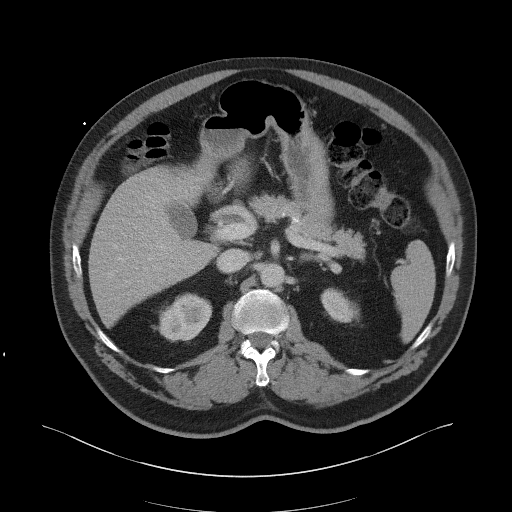
[im 86/103  soft-tissue]
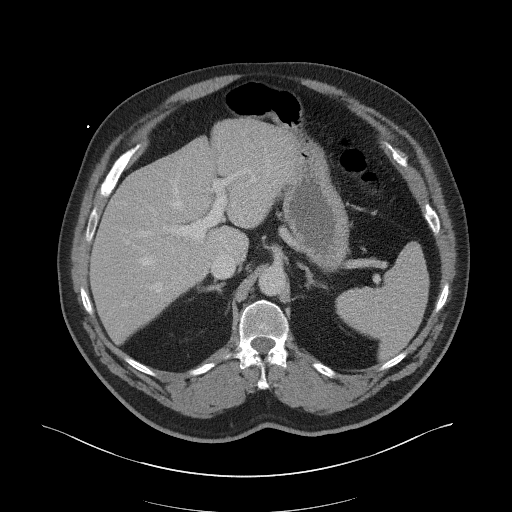
[im 97/103  soft-tissue]
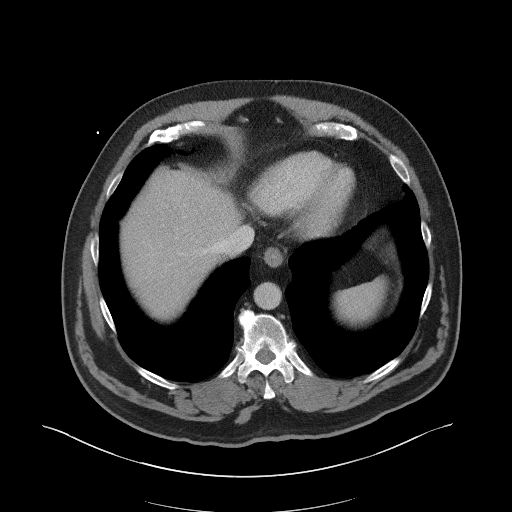

[Series 5: coronal st · coronal · 0.99mm/px · 3 of 128 slices shown]
[im 43/128  soft-tissue]
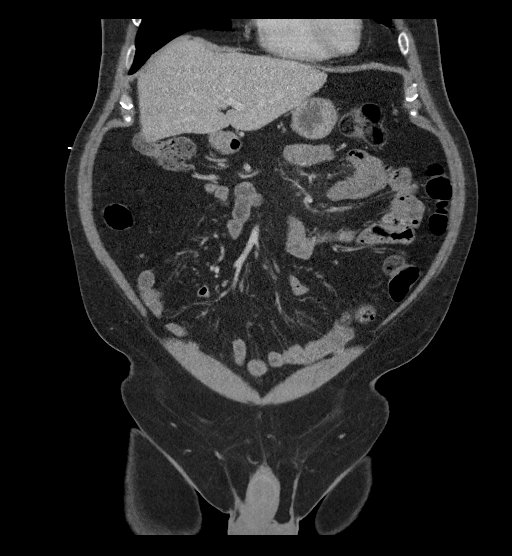
[im 57/128  soft-tissue]
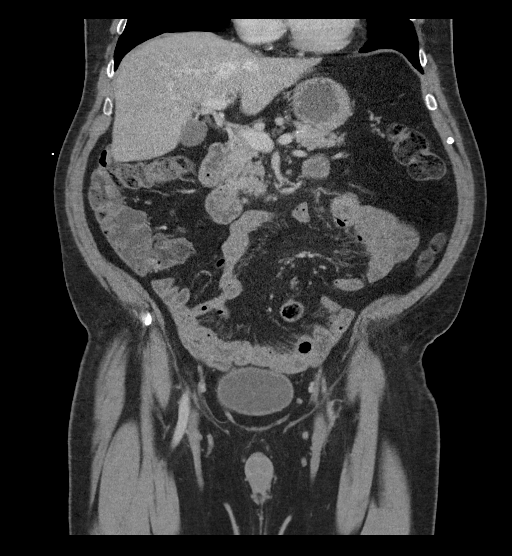
[im 71/128  soft-tissue]
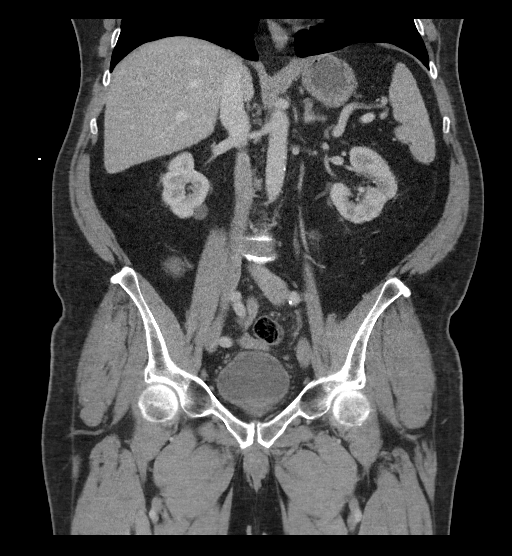

[16 of 46 positions shown; findings below may reference images not displayed]

FINDINGS: Lower chest: 3 mm subpleural nodule, left lower lobe, image 1,
series 4. Minor atelectasis or scarring at the base of the left
upper lobe lingula. Lung bases otherwise clear.

Hepatobiliary: No focal liver abnormality is seen. No gallstones,
gallbladder wall thickening, or biliary dilatation.

Pancreas: Unremarkable. No pancreatic ductal dilatation or
surrounding inflammatory changes.

Spleen: Normal in size without focal abnormality.

Adrenals/Urinary Tract: No adrenal masses.

Kidneys normal in overall size, orientation and position. 12 mm
low-density mass from the lower pole the right kidney consistent
with a cyst. There are additional bilateral subcentimeter
low-density renal lesions, too small to fully characterize, but also
consistent with cysts. No stones. No hydronephrosis. Normal ureters.
Normal bladder.

Stomach/Bowel: Normal stomach. Small bowel and colon are normal in
caliber. No wall thickening. No inflammation. No evidence of
appendicitis.

Vascular/Lymphatic: Aortic atherosclerosis. No aneurysm no enlarged
lymph nodes.

Reproductive: Enlarged prostate measuring 6.1 x 4.9 x 5.4 cm. Mild
hazy opacity fat between the prostate, seminal vesicles and bladder
base consistent with changes from the recent biopsy.

Other: No abdominal wall hernia or abnormality. No abdominopelvic
ascites.

Musculoskeletal: No fracture or acute finding.  No bone lesion.
IMPRESSION: 1. No acute findings. No findings to account for the patient's
abdominal pain or fever.
2. Mild haziness in the fat between the prostate seminal vesicles
and bladder base consistent with inflammation related to the recent
prostate biopsy. Prostate enlargement.
3. Aortic atherosclerosis.

## 2021-10-10 ENCOUNTER — Other Ambulatory Visit: Payer: Self-pay | Admitting: Family Medicine

## 2021-11-10 ENCOUNTER — Other Ambulatory Visit: Payer: 59

## 2021-11-10 ENCOUNTER — Other Ambulatory Visit: Payer: Self-pay

## 2021-11-10 DIAGNOSIS — C61 Malignant neoplasm of prostate: Secondary | ICD-10-CM

## 2021-11-11 LAB — PSA: Prostate Specific Ag, Serum: 4.3 ng/mL — ABNORMAL HIGH (ref 0.0–4.0)

## 2021-11-17 ENCOUNTER — Ambulatory Visit (INDEPENDENT_AMBULATORY_CARE_PROVIDER_SITE_OTHER): Payer: 59 | Admitting: Urology

## 2021-11-17 ENCOUNTER — Other Ambulatory Visit: Payer: Self-pay

## 2021-11-17 ENCOUNTER — Encounter: Payer: Self-pay | Admitting: Urology

## 2021-11-17 VITALS — BP 118/73 | HR 98

## 2021-11-17 DIAGNOSIS — C61 Malignant neoplasm of prostate: Secondary | ICD-10-CM

## 2021-11-17 NOTE — Patient Instructions (Signed)
Prostate Cancer °The prostate is a small gland that helps make semen. It is located below a man's bladder, in front of the rectum. Prostate cancer is when abnormal cells grow in this gland. °What are the causes? °The cause of this condition is not known. °What increases the risk? °Being age 67 or older. °Having a family history of prostate cancer. °Having a family history of cancer of the breasts or ovaries. °Having genes that are passed from parent to child (inherited). °Having Lynch syndrome. °African American men and men of African descent are diagnosed with prostate cancer at higher rates than other men. °What are the signs or symptoms? °Problems peeing (urinating). This may include: °A stream that is weak, or pee that stops and starts. °Trouble starting or stopping your pee. °Trouble emptying all of your pee. °Needing to pee more often, especially at night. °Blood in your pee or semen. °Pain in the: °Lower back. °Lower belly (abdomen). °Hips. °Trouble getting an erection. °Weakness or numbness in the legs or feet. °How is this treated? °Treatment for this condition depends on: °How much the cancer has spread. °Your age. °The kind of treatment you want. °Your health. °Treatments include: °Being watched. This is called observation. You will be tested from time to time, but you will not get treated. Tests are to make sure that the cancer is not growing. °Surgery. This may be done to: °Take out (remove) the prostate. °Freeze and kill cancer cells. °Radiation. This uses a strong beam of energy to kill cancer cells. °Chemotherapy. This uses medicines that stop cancer cells from increasing. This kills cancer cells and healthy cells. °Targeted therapy. This kills cancer cells only. Healthy cells are not affected. °Hormone treatment. This stops the body from making hormones that help the cancer cells grow. °Follow these instructions at home: °Lifestyle °Do not smoke or use any products that contain nicotine or tobacco.  If you need help quitting, ask your doctor. °Eat a healthy diet. °Treatment may affect your ability to have sex. If you have a partner, touch, hold, hug, and caress your partner to have intimate moments. °Get plenty of sleep. °Ask your doctor for help to find a support group for men with prostate cancer. °General instructions °Take over-the-counter and prescription medicines only as told by your doctor. °If you have to go to the hospital, let your cancer doctor (oncologist) know. °Keep all follow-up visits. °Where to find more information °American Cancer Society: www.cancer.org °American Society of Clinical Oncology: www.cancer.net °National Cancer Institute: www.cancer.gov °Contact a doctor if: °You have new or more trouble peeing. °You have new or more blood in your pee. °You have new or more pain in your hips, back, or chest. °Get help right away if: °You have weakness in your legs. °You lose feeling in your legs. °You cannot control your pee or your poop (stool). °You have chills or a fever. °Summary °The prostate is a male gland that helps make semen. °Prostate cancer is when abnormal cells grow in this gland. °Treatment includes doing surgery, using medicines, using strong beams of energy, or watching without treatment. °Ask your doctor for help to find a support group for men with prostate cancer. °Contact a doctor if you have problems peeing or have any new pain that you did not have before. °This information is not intended to replace advice given to you by your health care provider. Make sure you discuss any questions you have with your health care provider. °Document Revised: 03/10/2021 Document Reviewed: 03/10/2021 °Elsevier   Patient Education © 2022 [employer] ° °

## 2021-11-17 NOTE — Progress Notes (Signed)

## 2021-11-17 NOTE — Progress Notes (Signed)
11/17/2021 4:17 PM   [name] 04-Nov-1956 [id number]  Referring provider: No referring provider defined for this encounter.  Followup Prostate Cancer    HPI: Mr Jason Bartlett is a 65yo here for followup for prostate cancer on active surveillance. PSA increased to 4.3 from 3.3. He had COVID a couple of months ago. IPSS 1 QOL 0. Urine stream strong. No dysuria or hematuria. No other complaints today   PMH: Past Medical History:  Diagnosis Date   GERD (gastroesophageal reflux disease)    History of chicken pox    Hyperlipidemia    Hypertension    Prostate cancer [facility])     Surgical History: Past Surgical History:  Procedure Laterality Date   EXTERNAL EAR SURGERY     PROSTATE SURGERY      Home Medications:  Allergies as of 11/17/2021       Reactions   Penicillins Swelling   Has patient had a PCN reaction causing immediate rash, facial/tongue/throat swelling, SOB or lightheadedness with hypotension: Y Has patient had a PCN reaction causing severe rash involving mucus membranes or skin necrosis: Y Has patient had a PCN reaction that required hospitalization: N Has patient had a PCN reaction occurring within the last 10 years: N If all of the above answers are "NO", then may proceed with Cephalosporin use.   Sulfa Antibiotics Swelling        Medication List        Accurate as of November 17, 2021  4:17 PM. If you have any questions, ask your nurse or doctor.          acetaminophen 500 MG tablet Commonly known as: TYLENOL Take 1,000 mg by mouth every 6 (six) hours as needed for moderate pain.   allopurinol 100 MG tablet Commonly known as: ZYLOPRIM TAKE 1 TABLET(100 MG) BY MOUTH DAILY   lisinopril-hydrochlorothiazide 20-12.5 MG tablet Commonly known as: ZESTORETIC Take 1 tablet by mouth daily.   Multivitamin Adult Chew Chew 1 tablet by mouth daily.   tadalafil 5 MG tablet Commonly known as: CIALIS TAKE 1 TABLET(5 MG) BY MOUTH DAILY         Allergies:  Allergies  Allergen Reactions   Penicillins Swelling    Has patient had a PCN reaction causing immediate rash, facial/tongue/throat swelling, SOB or lightheadedness with hypotension: Y Has patient had a PCN reaction causing severe rash involving mucus membranes or skin necrosis: Y Has patient had a PCN reaction that required hospitalization: N Has patient had a PCN reaction occurring within the last 10 years: N If all of the above answers are "NO", then may proceed with Cephalosporin use.    Sulfa Antibiotics Swelling    Family History: Family History  Problem Relation Age of Onset   Cancer Mother        breast   Dementia Father    Hearing loss Father    Cancer Father        bone cancer   Hearing loss Brother    Early death Paternal Grandfather    Heart attack Paternal Grandfather     Social History:  reports that he has never smoked. He has never used smokeless tobacco. He reports that he does not currently use alcohol. He reports that he does not use drugs.  ROS: All other review of systems were reviewed and are negative except what is noted above in HPI  Physical Exam: BP 118/73   Pulse 98   Constitutional:  Alert and oriented, No acute distress. HEENT: [place]  AT, moist mucus membranes.  Trachea midline, no masses. Cardiovascular: No clubbing, cyanosis, or edema. Respiratory: Normal respiratory effort, no increased work of breathing. GI: Abdomen is soft, nontender, nondistended, no abdominal masses GU: No CVA tenderness.  Lymph: No cervical or inguinal lymphadenopathy. Skin: No rashes, bruises or suspicious lesions. Neurologic: Grossly intact, no focal deficits, moving all 4 extremities. Psychiatric: Normal mood and affect.  Laboratory Data: Lab Results  Component Value Date   WBC 8.3 02/16/2021   HGB 14.5 02/16/2021   HCT 42.9 02/16/2021   MCV 89.4 02/16/2021   PLT 217.0 02/16/2021    Lab Results  Component Value Date   CREATININE 1.28  08/16/2021    Lab Results  Component Value Date   PSA 9.77 (H) 10/15/2019   PSA 3.09 09/06/2018   PSA 2.9 02/08/2018    No results found for: TESTOSTERONE  Lab Results  Component Value Date   HGBA1C 6.4 08/16/2021    Urinalysis    Component Value Date/Time   COLORURINE YELLOW 02/07/2021 1848   APPEARANCEUR CLEAR 02/07/2021 1848   APPEARANCEUR Negative 11/04/2020 1131   LABSPEC 1.010 02/07/2021 1848   PHURINE 6.0 02/07/2021 1848   GLUCOSEU NEGATIVE 02/07/2021 1848   HGBUR LARGE (A) 02/07/2021 1848   BILIRUBINUR NEGATIVE 02/07/2021 1848   BILIRUBINUR Negative 11/04/2020 1131   [place] 02/07/2021 1848   PROTEINUR NEGATIVE 02/07/2021 1848   NITRITE NEGATIVE 02/07/2021 1848   LEUKOCYTESUR NEGATIVE 02/07/2021 1848    Lab Results  Component Value Date   LABMICR See below: 11/04/2020   WBCUA None seen 11/04/2020   LABEPIT None seen 11/04/2020   BACTERIA RARE (A) 02/07/2021    Pertinent Imaging:  No results found for this or any previous visit.  No results found for this or any previous visit.  No results found for this or any previous visit.  No results found for this or any previous visit.  No results found for this or any previous visit.  No results found for this or any previous visit.  No results found for this or any previous visit.  No results found for this or any previous visit.   Assessment & Plan:    1. Prostate cancer ([place]) -RTC 6 months with PSA   No follow-ups on file.  Nicolette Bang, MD  [facility] [place]

## 2022-01-17 ENCOUNTER — Other Ambulatory Visit: Payer: Self-pay | Admitting: Podiatry

## 2022-02-16 ENCOUNTER — Encounter: Payer: 59 | Admitting: Family Medicine

## 2022-02-25 ENCOUNTER — Encounter: Payer: Self-pay | Admitting: Family Medicine

## 2022-02-25 ENCOUNTER — Ambulatory Visit (INDEPENDENT_AMBULATORY_CARE_PROVIDER_SITE_OTHER): Payer: Medicare Other | Admitting: Family Medicine

## 2022-02-25 VITALS — BP 110/80 | HR 91 | Temp 98.2°F | Resp 16 | Ht 70.75 in | Wt 258.8 lb

## 2022-02-25 DIAGNOSIS — I10 Essential (primary) hypertension: Secondary | ICD-10-CM | POA: Diagnosis not present

## 2022-02-25 DIAGNOSIS — M1A09X0 Idiopathic chronic gout, multiple sites, without tophus (tophi): Secondary | ICD-10-CM | POA: Diagnosis not present

## 2022-02-25 DIAGNOSIS — K219 Gastro-esophageal reflux disease without esophagitis: Secondary | ICD-10-CM | POA: Diagnosis not present

## 2022-02-25 DIAGNOSIS — M109 Gout, unspecified: Secondary | ICD-10-CM | POA: Insufficient documentation

## 2022-02-25 DIAGNOSIS — E782 Mixed hyperlipidemia: Secondary | ICD-10-CM

## 2022-02-25 DIAGNOSIS — N529 Male erectile dysfunction, unspecified: Secondary | ICD-10-CM

## 2022-02-25 LAB — BASIC METABOLIC PANEL
BUN: 18 mg/dL (ref 6–23)
CO2: 27 mEq/L (ref 19–32)
Calcium: 9.4 mg/dL (ref 8.4–10.5)
Chloride: 99 mEq/L (ref 96–112)
Creatinine, Ser: 1.15 mg/dL (ref 0.40–1.50)
GFR: 65.59 mL/min (ref >60.00)
Glucose, Bld: 104 mg/dL — ABNORMAL HIGH (ref 70–99)
Potassium: 4.5 mEq/L (ref 3.5–5.1)
Sodium: 134 mEq/L — ABNORMAL LOW (ref 135–145)

## 2022-02-25 LAB — CBC WITH DIFFERENTIAL/PLATELET
Basophils Absolute: 0.1 10*3/uL (ref 0.0–0.1)
Basophils Relative: 1.3 % (ref 0.0–3.0)
Eosinophils Absolute: 0.4 10*3/uL (ref 0.0–0.7)
Eosinophils Relative: 5.1 % — ABNORMAL HIGH (ref 0.0–5.0)
HCT: 44.2 % (ref 39.0–52.0)
Hemoglobin: 14.7 g/dL (ref 13.0–17.0)
Lymphocytes Relative: 19.7 % (ref 12.0–46.0)
Lymphs Abs: 1.6 10*3/uL (ref 0.7–4.0)
MCHC: 33.4 g/dL (ref 30.0–36.0)
MCV: 91.1 fl (ref 78.0–100.0)
Monocytes Absolute: 0.7 10*3/uL (ref 0.1–1.0)
Monocytes Relative: 8.8 % (ref 3.0–12.0)
Neutro Abs: 5.2 10*3/uL (ref 1.4–7.7)
Neutrophils Relative %: 65.1 % (ref 43.0–77.0)
Platelets: 178 10*3/uL (ref 150.0–400.0)
RBC: 4.85 Mil/uL (ref 4.22–5.81)
RDW: 14.3 % (ref 11.5–15.5)
WBC: 8 10*3/uL (ref 4.0–10.5)

## 2022-02-25 LAB — HEPATIC FUNCTION PANEL
ALT: 26 U/L (ref 0–53)
AST: 21 U/L (ref 0–37)
Albumin: 4.5 g/dL (ref 3.5–5.2)
Alkaline Phosphatase: 39 U/L (ref 39–117)
Bilirubin, Direct: 0.2 mg/dL (ref 0.0–0.3)
Total Bilirubin: 1.1 mg/dL (ref 0.2–1.2)
Total Protein: 7.8 g/dL (ref 6.0–8.3)

## 2022-02-25 LAB — LIPID PANEL
Cholesterol: 174 mg/dL (ref 0–200)
HDL: 33.7 mg/dL — ABNORMAL LOW (ref >39.00)
LDL Cholesterol: 101 mg/dL — ABNORMAL HIGH (ref 0–99)
NonHDL: 140.44
Total CHOL/HDL Ratio: 5
Triglycerides: 198 mg/dL — ABNORMAL HIGH (ref 0.0–149.0)
VLDL: 39.6 mg/dL (ref 0.0–40.0)

## 2022-02-25 LAB — TSH: TSH: 2.08 u[IU]/mL (ref 0.35–5.50)

## 2022-02-25 MED ORDER — ALLOPURINOL 100 MG PO TABS: ORAL_TABLET | ORAL | 1 refills | Status: DC

## 2022-02-25 MED ORDER — TADALAFIL 10 MG PO TABS: 10.0000 mg | ORAL_TABLET | Freq: Every day | ORAL | 6 refills | Status: DC

## 2022-02-25 MED ORDER — TADALAFIL 10 MG PO TABS: 10.0000 mg | ORAL_TABLET | ORAL | 6 refills | Status: DC | PRN

## 2022-02-25 MED ORDER — OMEPRAZOLE 40 MG PO CPDR: 40.0000 mg | DELAYED_RELEASE_CAPSULE | Freq: Every day | ORAL | 3 refills | Status: DC

## 2022-02-25 NOTE — Assessment & Plan Note (Signed)
Ongoing issue due to prostate cancer.  Will increase tadalafil dose to 10mg  daily.  Pt expressed understanding and is in agreement w/ plan.   ?

## 2022-02-25 NOTE — Assessment & Plan Note (Signed)
Ongoing issue for pt.  BMI 36.35 but w/ his other comorbidities this qualifies as morbidly obese.  Stressed need for healthy diet and regular exercise.  Will follow. ?

## 2022-02-25 NOTE — Assessment & Plan Note (Signed)
New to provider, ongoing for pt.  Sxs have worsened since he stopped OTC Omeprazole due to cost.  Will send prescription for pt.  Discussed that this is likely the cause of his constant throat clearing.  Will follow. ?

## 2022-02-25 NOTE — Assessment & Plan Note (Signed)
Chronic problem.  Well controlled on Lisinopril HCTZ.  Asymptomatic at this time.  Check labs due to ACE and diuretic but no anticipated med changes. ?

## 2022-02-25 NOTE — Assessment & Plan Note (Signed)
Chronic problem.  Has recently been controlled w/ lifestyle measures.  Check labs and start meds prn. ?

## 2022-02-25 NOTE — Patient Instructions (Signed)
Follow up in 6 months to recheck BP and cholesterol ?We'll notify you of your lab results and make any changes if needed ?Continue to work on healthy diet and regular exercise- you can do it! ?Take the Allopurinol daily to prevent gout ?Take the Tadalafil daily as directed ?START the Omeprazole once daily- this will improve your throat clearing ?Call with any questions or concerns ?Stay Safe!  Stay Healthy! ?Have a great weekend!  ?

## 2022-02-25 NOTE — Assessment & Plan Note (Signed)
Ongoing issue for pt.  Would like to get his allopurinol prescription from me for convenience.  Prescription sent. ?

## 2022-02-25 NOTE — Progress Notes (Signed)
? ?  Subjective:  ? ? Patient ID: Jason Bartlett, male    DOB: [birth year], 66 y.o.   MRN: [id number] ? ?HPI ?HTN- chronic problem, on Lisinopril HCTZ 20/12.5mg  daily w/ good control.  Pt reports much less stress since retirement.  No CP, SOB, HAs, visual changes, edema. ? ?Hyperlipidemia- chronic problem, pt has recently been able to control w/ diet and exercise.  Denies abd pain, N/V. ? ?Gout- pt had been getting Allopurinol from Dr Paulla Dolly but would prefer for me to fill this as Dr Mellody Drown office is far for him.  Pt reports he ran out of medication and had a flare. ? ?Constipation- pt reports he requires a laxative to have daily BM.  He is trying to increase his fiber.  Good water intake.  Taking Dulcolax daily for the last month.  He takes this at night and it works by morning. ? ?Morbid obesity- pt's BMI is 36.35.  this coupled w/ his medical issues qualifies as morbidly obese.  Pt is walking for an hour daily ? ?Throat clearing- pt reports he needs to do this regularly and it is an 'annoyance'.  Denies sinus congestion.  Pt reports ongoing GERD.  In August he doubled his Prilosec which improved sxs but became cost prohibitive.   ? ?ED- ongoing issue due to Prostate Cancer.  Would like to increase dose if possible ? ? ?Review of Systems ?For ROS see HPI  ? ?This visit occurred during the SARS-CoV-2 public health emergency.  Safety protocols were in place, including screening questions prior to the visit, additional usage of staff PPE, and extensive cleaning of exam room while observing appropriate contact time as indicated for disinfecting solutions.   ?   ?Objective:  ? Physical Exam ?Vitals reviewed.  ?Constitutional:   ?   General: He is not in acute distress. ?   Appearance: Normal appearance. He is well-developed. He is obese. He is not ill-appearing.  ?HENT:  ?   Head: Normocephalic and atraumatic.  ?Eyes:  ?   Extraocular Movements: Extraocular movements intact.  ?   Conjunctiva/sclera: Conjunctivae normal.   ?   Pupils: Pupils are equal, round, and reactive to light.  ?Neck:  ?   Thyroid: No thyromegaly.  ?Cardiovascular:  ?   Rate and Rhythm: Normal rate and regular rhythm.  ?   Pulses: Normal pulses.  ?   Heart sounds: Normal heart sounds. No murmur heard. ?Pulmonary:  ?   Effort: Pulmonary effort is normal. No respiratory distress.  ?   Breath sounds: Normal breath sounds.  ?Abdominal:  ?   General: Bowel sounds are normal. There is no distension.  ?   Palpations: Abdomen is soft.  ?Musculoskeletal:  ?   Cervical back: Normal range of motion and neck supple.  ?   Right lower leg: No edema.  ?   Left lower leg: No edema.  ?Lymphadenopathy:  ?   Cervical: No cervical adenopathy.  ?Skin: ?   General: Skin is warm and dry.  ?Neurological:  ?   General: No focal deficit present.  ?   Mental Status: He is alert and oriented to person, place, and time.  ?   Cranial Nerves: No cranial nerve deficit.  ?Psychiatric:     ?   Mood and Affect: Mood normal.     ?   Behavior: Behavior normal.  ? ? ? ? ? ?   ?Assessment & Plan:  ? ? ?

## 2022-03-01 ENCOUNTER — Telehealth: Payer: Self-pay

## 2022-03-01 NOTE — Telephone Encounter (Signed)
Patient aware of labs.  

## 2022-03-01 NOTE — Telephone Encounter (Signed)
-----   Message from Midge Minium, MD sent at 03/01/2022  9:52 AM EST ----- ?Labs look good!  No changes at this time.  Continue to work on healthy diet and regular exercise to keep those cholesterol numbers under control. ?

## 2022-03-08 DIAGNOSIS — H52223 Regular astigmatism, bilateral: Secondary | ICD-10-CM | POA: Diagnosis not present

## 2022-03-08 DIAGNOSIS — H524 Presbyopia: Secondary | ICD-10-CM | POA: Diagnosis not present

## 2022-03-08 DIAGNOSIS — H353131 Nonexudative age-related macular degeneration, bilateral, early dry stage: Secondary | ICD-10-CM | POA: Diagnosis not present

## 2022-05-17 ENCOUNTER — Ambulatory Visit: Payer: 59 | Admitting: Urology

## 2022-07-25 ENCOUNTER — Ambulatory Visit (INDEPENDENT_AMBULATORY_CARE_PROVIDER_SITE_OTHER): Payer: Medicare Other | Admitting: Family Medicine

## 2022-07-25 ENCOUNTER — Encounter: Payer: Self-pay | Admitting: Family Medicine

## 2022-07-25 VITALS — BP 136/84 | HR 86 | Temp 99.1°F | Resp 17 | Ht 70.75 in | Wt 263.4 lb

## 2022-07-25 DIAGNOSIS — R058 Other specified cough: Secondary | ICD-10-CM

## 2022-07-25 MED ORDER — PREDNISONE 10 MG PO TABS: ORAL_TABLET | ORAL | 0 refills | Status: DC

## 2022-07-25 MED ORDER — ALBUTEROL SULFATE HFA 108 (90 BASE) MCG/ACT IN AERS: 2.0000 | INHALATION_SPRAY | Freq: Four times a day (QID) | RESPIRATORY_TRACT | 0 refills | Status: DC | PRN

## 2022-07-25 NOTE — Patient Instructions (Signed)
Follow up as needed or as scheduled START the Prednisone as directed- take w/ food Use the Albuterol as needed for cough and wheezing Drink plenty of fluids REST!!! Call with any questions or concerns Hang in there!!!

## 2022-07-25 NOTE — Progress Notes (Signed)
   Subjective:    Patient ID: Jason Bartlett, male    DOB: 31-Jan-1956, 66 y.o.   MRN: [id number]  HPI Cough- pt had COVID ~1 month ago and has been coughing since.  Cough is a dry cough, rarely productive.  Causing some hoarseness.  Cough is worse at night, preventing sleep.  Notes wheezing when lying down.  Some relief w/ albuterol inhaler.  Pt has hx of childhood asthma.     Review of Systems For ROS see HPI     Objective:   Physical Exam Vitals reviewed.  Constitutional:      General: He is not in acute distress.    Appearance: Normal appearance. He is well-developed. He is not ill-appearing.  HENT:     Head: Normocephalic and atraumatic.     Right Ear: Tympanic membrane normal.     Left Ear: Tympanic membrane normal.     Nose: No mucosal edema or rhinorrhea.     Right Sinus: No maxillary sinus tenderness or frontal sinus tenderness.     Left Sinus: No maxillary sinus tenderness or frontal sinus tenderness.     Mouth/Throat:     Mouth: Mucous membranes are moist.     Pharynx: No oropharyngeal exudate or posterior oropharyngeal erythema.  Eyes:     Conjunctiva/sclera: Conjunctivae normal.     Pupils: Pupils are equal, round, and reactive to light.  Cardiovascular:     Rate and Rhythm: Normal rate and regular rhythm.     Heart sounds: Normal heart sounds.  Pulmonary:     Effort: Pulmonary effort is normal. No respiratory distress.     Breath sounds: Wheezing (diffuse expiratory wheezes) present.     Comments: Cough w/ every attempted deep breath Musculoskeletal:     Cervical back: Normal range of motion and neck supple.  Lymphadenopathy:     Cervical: No cervical adenopathy.  Skin:    General: Skin is warm and dry.  Neurological:     General: No focal deficit present.     Mental Status: He is alert and oriented to person, place, and time.  Psychiatric:        Mood and Affect: Mood normal.        Behavior: Behavior normal.        Thought Content: Thought content  normal.           Assessment & Plan:   Post-viral cough- new.  Pt reports he has had cough since having COVID ~1 month ago.  Cough is dry, irritating, worse at night and when lying down.  + wheeze.  Some relief w/ old albuterol inhaler.  Likely diffuse airway inflammation.  Start Prednisone and new albuterol inhaler sent.  Pt expressed understanding and is in agreement w/ plan.

## 2022-08-12 ENCOUNTER — Encounter: Payer: Self-pay | Admitting: Family Medicine

## 2022-08-12 ENCOUNTER — Other Ambulatory Visit: Payer: Self-pay

## 2022-08-12 ENCOUNTER — Telehealth: Payer: Self-pay | Admitting: Family Medicine

## 2022-08-12 ENCOUNTER — Ambulatory Visit (HOSPITAL_BASED_OUTPATIENT_CLINIC_OR_DEPARTMENT_OTHER)
Admission: RE | Admit: 2022-08-12 | Discharge: 2022-08-12 | Disposition: A | Discharge status: Home / Self Care | Payer: Medicare Other | Source: Ambulatory Visit | Attending: Family Medicine | Admitting: Family Medicine

## 2022-08-12 ENCOUNTER — Ambulatory Visit (INDEPENDENT_AMBULATORY_CARE_PROVIDER_SITE_OTHER): Payer: Medicare Other | Admitting: Family Medicine

## 2022-08-12 VITALS — BP 116/70 | HR 84 | Temp 98.6°F | Resp 16 | Ht 70.5 in | Wt 265.5 lb

## 2022-08-12 DIAGNOSIS — R051 Acute cough: Secondary | ICD-10-CM

## 2022-08-12 DIAGNOSIS — I10 Essential (primary) hypertension: Secondary | ICD-10-CM

## 2022-08-12 DIAGNOSIS — R059 Cough, unspecified: Secondary | ICD-10-CM | POA: Diagnosis not present

## 2022-08-12 MED ORDER — PREDNISONE 10 MG PO TABS: ORAL_TABLET | ORAL | 0 refills | Status: DC

## 2022-08-12 MED ORDER — LISINOPRIL-HYDROCHLOROTHIAZIDE 20-12.5 MG PO TABS: 1.0000 | ORAL_TABLET | Freq: Every day | ORAL | 1 refills | Status: DC

## 2022-08-12 MED ORDER — GUAIFENESIN-CODEINE 100-10 MG/5ML PO SYRP: 10.0000 mL | ORAL_SOLUTION | Freq: Three times a day (TID) | ORAL | 0 refills | Status: DC | PRN

## 2022-08-12 MED ORDER — ALBUTEROL SULFATE HFA 108 (90 BASE) MCG/ACT IN AERS
2.0000 | INHALATION_SPRAY | Freq: Four times a day (QID) | RESPIRATORY_TRACT | 0 refills | Status: DC | PRN
Start: 2022-08-12 — End: 2024-01-22

## 2022-08-12 MED ORDER — ALLOPURINOL 100 MG PO TABS: ORAL_TABLET | ORAL | 1 refills | Status: DC

## 2022-08-12 MED ORDER — AZITHROMYCIN 250 MG PO TABS: ORAL_TABLET | ORAL | 0 refills | Status: DC

## 2022-08-12 NOTE — Patient Instructions (Signed)
Follow up as needed or as scheduled Go to the [facility] on [address] to get your chest xray START the Zpack today as directed RESTART the Prednisone to help w/ the wheezing and tightness CONTINUE the Albuterol inhaler- 2 puffs as needed for cough/wheezing USE the codeine cough syrup to help get some rest! Call with any questions or concerns Hang in there!!!

## 2022-08-12 NOTE — Telephone Encounter (Signed)
I spoke to the pt and advised him that DR Birdie Riddle has not resulted his Xray results as of now . When she does we will contact him

## 2022-08-12 NOTE — Telephone Encounter (Signed)
Caller name: Windell Norfolk  On DPR? :yes/no: Yes  Call back number: [phone]  Provider they see: Birdie Riddle   Reason for call: Wife called to see if Dr. Birdie Riddle received her husband x-ray result

## 2022-08-12 NOTE — Progress Notes (Signed)
   Subjective:    Patient ID: Jason Bartlett, male    DOB: 11-19-1956, 66 y.o.   MRN: [id number]  HPI Cough- pt was seen on 7/31 w/ post viral cough.  Finished 9 days of prednisone and is using Albuterol.  Pt reports he did get better on the prednisone but 'it came back w/ a vengeance'.  Continues to cough.  Unable to sleep.  Coughing so hard he has a headache.   Review of Systems For ROS see HPI     Objective:   Physical Exam Vitals reviewed.  Constitutional:      General: He is not in acute distress.    Appearance: Normal appearance. He is well-developed. He is not ill-appearing.  HENT:     Head: Normocephalic and atraumatic.     Right Ear: Tympanic membrane normal.     Left Ear: Tympanic membrane normal.     Nose: No mucosal edema or rhinorrhea.     Right Sinus: No maxillary sinus tenderness or frontal sinus tenderness.     Left Sinus: No maxillary sinus tenderness or frontal sinus tenderness.     Mouth/Throat:     Pharynx: No oropharyngeal exudate or posterior oropharyngeal erythema.  Eyes:     Conjunctiva/sclera: Conjunctivae normal.     Pupils: Pupils are equal, round, and reactive to light.  Cardiovascular:     Rate and Rhythm: Normal rate and regular rhythm.     Pulses: Normal pulses.     Heart sounds: Normal heart sounds.  Pulmonary:     Effort: Pulmonary effort is normal. No respiratory distress.     Breath sounds: Wheezing (diffuse inspiratory and expiratory wheezes) present.     Comments: Deep hacking cough Musculoskeletal:     Cervical back: Normal range of motion and neck supple.  Lymphadenopathy:     Cervical: No cervical adenopathy.  Skin:    General: Skin is warm and dry.  Neurological:     General: No focal deficit present.     Mental Status: He is alert and oriented to person, place, and time.  Psychiatric:        Mood and Affect: Mood normal.        Behavior: Behavior normal.        Thought Content: Thought content normal.            Assessment & Plan:  Cough- ongoing issue for pt.  He improved on prednisone but 3 days after medication was completed the cough returned and it is worse.  He can't lie down, he can't sleep.  Cough is deep, hacking, and he is diffusely wheezing.  Will get CXR to assess.  Start Zpack for possible atypical infxn.  Restart prednisone taper due to extensive wheezing.  Cough syrup prn.  Reviewed supportive care and red flags that should prompt return.  Pt expressed understanding and is in agreement w/ plan.

## 2022-08-16 NOTE — Progress Notes (Signed)
Pt seen results via my chart  

## 2022-08-30 ENCOUNTER — Encounter: Payer: Self-pay | Admitting: Family Medicine

## 2022-08-30 ENCOUNTER — Other Ambulatory Visit: Payer: Self-pay

## 2022-08-30 ENCOUNTER — Ambulatory Visit (INDEPENDENT_AMBULATORY_CARE_PROVIDER_SITE_OTHER): Payer: Medicare Other | Admitting: Family Medicine

## 2022-08-30 VITALS — BP 128/80 | HR 90 | Temp 97.6°F | Resp 18 | Ht 70.5 in | Wt 269.0 lb

## 2022-08-30 DIAGNOSIS — E782 Mixed hyperlipidemia: Secondary | ICD-10-CM

## 2022-08-30 DIAGNOSIS — I10 Essential (primary) hypertension: Secondary | ICD-10-CM

## 2022-08-30 LAB — CBC WITH DIFFERENTIAL/PLATELET
Basophils Absolute: 0.2 10*3/uL — ABNORMAL HIGH (ref 0.0–0.1)
Basophils Relative: 2.2 % (ref 0.0–3.0)
Eosinophils Absolute: 0.5 10*3/uL (ref 0.0–0.7)
Eosinophils Relative: 6.4 % — ABNORMAL HIGH (ref 0.0–5.0)
HCT: 42.2 % (ref 39.0–52.0)
Hemoglobin: 14.2 g/dL (ref 13.0–17.0)
Lymphocytes Relative: 17.5 % (ref 12.0–46.0)
Lymphs Abs: 1.2 10*3/uL (ref 0.7–4.0)
MCHC: 33.7 g/dL (ref 30.0–36.0)
MCV: 92.2 fl (ref 78.0–100.0)
Monocytes Absolute: 0.7 10*3/uL (ref 0.1–1.0)
Monocytes Relative: 9.3 % (ref 3.0–12.0)
Neutro Abs: 4.6 10*3/uL (ref 1.4–7.7)
Neutrophils Relative %: 64.6 % (ref 43.0–77.0)
Platelets: 173 10*3/uL (ref 150.0–400.0)
RBC: 4.58 Mil/uL (ref 4.22–5.81)
RDW: 14.2 % (ref 11.5–15.5)
WBC: 7.1 10*3/uL (ref 4.0–10.5)

## 2022-08-30 LAB — LIPID PANEL
Cholesterol: 151 mg/dL (ref 0–200)
HDL: 32.6 mg/dL — ABNORMAL LOW (ref >39.00)
LDL Cholesterol: 81 mg/dL (ref 0–99)
NonHDL: 118.1
Total CHOL/HDL Ratio: 5
Triglycerides: 186 mg/dL — ABNORMAL HIGH (ref 0.0–149.0)
VLDL: 37.2 mg/dL (ref 0.0–40.0)

## 2022-08-30 LAB — BASIC METABOLIC PANEL
BUN: 15 mg/dL (ref 6–23)
CO2: 31 mEq/L (ref 19–32)
Calcium: 9.6 mg/dL (ref 8.4–10.5)
Chloride: 101 mEq/L (ref 96–112)
Creatinine, Ser: 1.08 mg/dL (ref 0.40–1.50)
GFR: 70.47 mL/min (ref >60.00)
Glucose, Bld: 110 mg/dL — ABNORMAL HIGH (ref 70–99)
Potassium: 4.8 mEq/L (ref 3.5–5.1)
Sodium: 141 mEq/L (ref 135–145)

## 2022-08-30 LAB — HEPATIC FUNCTION PANEL
ALT: 27 U/L (ref 0–53)
AST: 23 U/L (ref 0–37)
Albumin: 4.1 g/dL (ref 3.5–5.2)
Alkaline Phosphatase: 37 U/L — ABNORMAL LOW (ref 39–117)
Bilirubin, Direct: 0.1 mg/dL (ref 0.0–0.3)
Total Bilirubin: 0.6 mg/dL (ref 0.2–1.2)
Total Protein: 7.6 g/dL (ref 6.0–8.3)

## 2022-08-30 LAB — TSH: TSH: 1.29 u[IU]/mL (ref 0.35–5.50)

## 2022-08-30 NOTE — Assessment & Plan Note (Signed)
BMI now 38.05  Stressed need for healthy diet and regular exercise.  Check labs to risk stratify.  Will follow.

## 2022-08-30 NOTE — Assessment & Plan Note (Signed)
Chronic problem.  Adequate control today w/ Lisinopril HCTZ 20/12.'5mg'$  daily.  Currently asymptomatic.  Check labs due to ACE and diuretic but no anticipated med changes.

## 2022-08-30 NOTE — Assessment & Plan Note (Signed)
Chronic problem.  Has been able to control w/ diet and exercise but admits he has not been exercising recently.  Check labs and determine if medication is needed.

## 2022-08-30 NOTE — Patient Instructions (Signed)
Schedule your complete physical in 6 months We'll notify you of your lab results and make any changes if needed Continue to work on healthy diet and regular exercise- you can do it! RESTART a daily Claritin or Zyrtec daily (store brand generic is just as good!) ADD Fluticasone (Flonase) nasal spray- 2 sprays each nostril daily Call with any questions or concerns Stay Safe!  Stay Healthy!

## 2022-08-30 NOTE — Progress Notes (Signed)
   Subjective:    Patient ID: Jason Bartlett, male    DOB: 07/09/1956, 66 y.o.   MRN: [id number]  HPI HTN- chronic problem, Lisinopril HCTZ 20/12.'5mg'$  daily w/ adequate control.  No CP, SOB, HAs, visual changes, edema.  Hyperlipidemia- chronic problem, pt has been attempting to control w/ diet and regular exercise.  Denies abd pain, N/V.  Obesity- BMI 38.05 but given his HTN, hyperlipidemia this qualifies as morbidly obese.  Was walking regularly until he developed a cough.   Review of Systems For ROS see HPI     Objective:   Physical Exam Vitals reviewed.  Constitutional:      General: He is not in acute distress.    Appearance: Normal appearance. He is well-developed. He is obese. He is not ill-appearing.  HENT:     Head: Normocephalic and atraumatic.  Eyes:     Extraocular Movements: Extraocular movements intact.     Conjunctiva/sclera: Conjunctivae normal.     Pupils: Pupils are equal, round, and reactive to light.  Neck:     Thyroid: No thyromegaly.  Cardiovascular:     Rate and Rhythm: Normal rate and regular rhythm.     Pulses: Normal pulses.     Heart sounds: Normal heart sounds. No murmur heard. Pulmonary:     Effort: Pulmonary effort is normal. No respiratory distress.     Breath sounds: Normal breath sounds.  Abdominal:     General: Bowel sounds are normal. There is no distension.     Palpations: Abdomen is soft.  Musculoskeletal:     Cervical back: Normal range of motion and neck supple.     Right lower leg: No edema.     Left lower leg: No edema.  Lymphadenopathy:     Cervical: No cervical adenopathy.  Skin:    General: Skin is warm and dry.  Neurological:     General: No focal deficit present.     Mental Status: He is alert and oriented to person, place, and time.     Cranial Nerves: No cranial nerve deficit.  Psychiatric:        Mood and Affect: Mood normal.        Behavior: Behavior normal.           Assessment & Plan:

## 2022-08-31 ENCOUNTER — Telehealth: Payer: Self-pay

## 2022-08-31 NOTE — Telephone Encounter (Signed)
Informed pt of lab resuts

## 2022-08-31 NOTE — Telephone Encounter (Signed)
-----   Message from Midge Minium, MD sent at 08/31/2022  7:27 AM EDT ----- Labs look good!  No changes at this time.  Sugar is up just a little bit but this will improve w/ low carb/low sugar diet and regular exercise.

## 2022-09-02 ENCOUNTER — Telehealth: Payer: Self-pay | Admitting: Family Medicine

## 2022-09-02 DIAGNOSIS — R052 Subacute cough: Secondary | ICD-10-CM

## 2022-09-02 MED ORDER — FLUTICASONE PROPIONATE HFA 110 MCG/ACT IN AERO: 1.0000 | INHALATION_SPRAY | Freq: Two times a day (BID) | RESPIRATORY_TRACT | 1 refills | Status: DC

## 2022-09-02 NOTE — Telephone Encounter (Signed)
Wife called back wanting a update.

## 2022-09-02 NOTE — Telephone Encounter (Signed)
Chart reviewed in Dr. Virgil Benedict absence.  Based on 08/12/2022 note cough improved after prednisone started, then returned with completion.  Azithromycin was prescribed for atypical infection and prednisone taper restarted due to wheeze.  Codeine cough syrup and albuterol also prescribed. Chest x-ray 08/14/2022 without active cardiopulmonary disease. September 5 visit noted.  Recommended restarting Claritin or Zyrtec, added Flonase nasal spray.  called patient. feels like cough overall about the same, maybe worse. Prednisone helped when used before and returns in few days.  Using flonase 2spr total per day.  Zyrtec once per day.  Robitussin DM BID Albuterol inhaler - sometimes helps, other times not. Using 3-4 times per day with some wheeze noted - improves some.  Only dyspnea with coughing fits. No new swelling or chest pain. No hx of CHF.  On omeprazole '40mg'$  daily. No breakthrough heartburn.   With 2 separate episodes of improved cough on prednisone, along with wheeze with cough, suspicious for reactive airway/cough variant asthma.  Start Flovent, continue albuterol if needed for breakthrough wheezing, continue Flonase, Zyrtec, Robitussin, omeprazole same doses for now.  Update on symptoms in the next 3 days.    To Dr. Birdie Riddle as Juluis Rainier.

## 2022-09-02 NOTE — Telephone Encounter (Signed)
Message was froward to Dr Carlota Raspberry and I sent Cecelia Byars a teams stating I sent it to him and he will need to answer since Dr Birdie Riddle is out of office

## 2022-09-02 NOTE — Telephone Encounter (Signed)
Caller name: Windell Norfolk   On DPR? :yes/no: Yes  Call back number:[phone]  Provider they see: Birdie Riddle   Reason for call: Wife called stating that husband hasn't stop coughing since he had Covid in July.  Pt can't lay down  in the bed because pt is coughing so bad. Wife states that pt has did everything Dr.tabori told him to do and nothing is working. Wife states they will be outing of town next week. Wife wants know what should he do now. Pt last visit was 08/30/22.

## 2022-09-02 NOTE — Telephone Encounter (Signed)
Dr Carlota Raspberry could you please advise what we can do to help this pt ?

## 2022-09-14 DIAGNOSIS — K219 Gastro-esophageal reflux disease without esophagitis: Secondary | ICD-10-CM | POA: Diagnosis not present

## 2022-09-14 DIAGNOSIS — Z8616 Personal history of COVID-19: Secondary | ICD-10-CM | POA: Diagnosis not present

## 2022-09-14 DIAGNOSIS — H9011 Conductive hearing loss, unilateral, right ear, with unrestricted hearing on the contralateral side: Secondary | ICD-10-CM | POA: Diagnosis not present

## 2022-09-14 DIAGNOSIS — Z9889 Other specified postprocedural states: Secondary | ICD-10-CM | POA: Diagnosis not present

## 2022-09-14 DIAGNOSIS — R053 Chronic cough: Secondary | ICD-10-CM | POA: Insufficient documentation

## 2022-11-29 ENCOUNTER — Other Ambulatory Visit: Payer: Self-pay

## 2022-11-29 MED ORDER — TADALAFIL 10 MG PO TABS: 10.0000 mg | ORAL_TABLET | Freq: Every day | ORAL | 6 refills | Status: DC

## 2022-11-30 ENCOUNTER — Ambulatory Visit (INDEPENDENT_AMBULATORY_CARE_PROVIDER_SITE_OTHER): Payer: Medicare Other | Admitting: *Deleted

## 2022-11-30 DIAGNOSIS — Z0000 Encounter for general adult medical examination without abnormal findings: Secondary | ICD-10-CM | POA: Diagnosis not present

## 2022-11-30 NOTE — Patient Instructions (Signed)
Jason Bartlett , Thank you for taking time to come for your Medicare Wellness Visit. I appreciate your ongoing commitment to your health goals. Please review the following plan we discussed and let me know if I can assist you in the future.   These are the goals we discussed:  Goals      Increase physical activity        This is a list of the screening recommended for you and due dates:  Health Maintenance  Topic Date Due   Flu Shot  03/26/2023*   Pneumonia Vaccine (1 - PCV) 07/26/2023*   Medicare Annual Wellness Visit  12/01/2023   DTaP/Tdap/Td vaccine (2 - Td or Tdap) 02/09/2028   Colon Cancer Screening  03/20/2028   Zoster (Shingles) Vaccine  Completed   HPV Vaccine  Aged Out   COVID-19 Vaccine  Discontinued   Hepatitis C Screening: USPSTF Recommendation to screen - Ages 32-79 yo.  Discontinued  *Topic was postponed. The date shown is not the original due date.    Advanced directives: Education provided (mailed forms)  Conditions/risks identified:   Next appointment: Follow up in one year for your annual wellness visit. 02-28-2023 @ 7:40  [place]  Preventive Care 49 Years and Older, Male  Preventive care refers to lifestyle choices and visits with your health care provider that can promote health and wellness. What does preventive care include? A yearly physical exam. This is also called an annual well check. Dental exams once or twice a year. Routine eye exams. Ask your health care provider how often you should have your eyes checked. Personal lifestyle choices, including: Daily care of your teeth and gums. Regular physical activity. Eating a healthy diet. Avoiding tobacco and drug use. Limiting alcohol use. Practicing safe sex. Taking low doses of aspirin every day. Taking vitamin and mineral supplements as recommended by your health care provider. What happens during an annual well check? The services and screenings done by your health care provider during your annual  well check will depend on your age, overall health, lifestyle risk factors, and family history of disease. Counseling  Your health care provider may ask you questions about your: Alcohol use. Tobacco use. Drug use. Emotional well-being. Home and relationship well-being. Sexual activity. Eating habits. History of falls. Memory and ability to understand (cognition). Work and work Statistician. Screening  You may have the following tests or measurements: Height, weight, and BMI. Blood pressure. Lipid and cholesterol levels. These may be checked every 5 years, or more frequently if you are over 58 years old. Skin check. Lung cancer screening. You may have this screening every year starting at age 32 if you have a 30-pack-year history of smoking and currently smoke or have quit within the past 15 years. Fecal occult blood test (FOBT) of the stool. You may have this test every year starting at age 73. Flexible sigmoidoscopy or colonoscopy. You may have a sigmoidoscopy every 5 years or a colonoscopy every 10 years starting at age 71. Prostate cancer screening. Recommendations will vary depending on your family history and other risks. Hepatitis C blood test. Hepatitis B blood test. Sexually transmitted disease (STD) testing. Diabetes screening. This is done by checking your blood sugar (glucose) after you have not eaten for a while (fasting). You may have this done every 1-3 years. Abdominal aortic aneurysm (AAA) screening. You may need this if you are a current or former smoker. Osteoporosis. You may be screened starting at age 51 if you are at high  risk. Talk with your health care provider about your test results, treatment options, and if necessary, the need for more tests. Vaccines  Your health care provider may recommend certain vaccines, such as: Influenza vaccine. This is recommended every year. Tetanus, diphtheria, and acellular pertussis (Tdap, Td) vaccine. You may need a Td booster  every 10 years. Zoster vaccine. You may need this after age 62. Pneumococcal 13-valent conjugate (PCV13) vaccine. One dose is recommended after age 73. Pneumococcal polysaccharide (PPSV23) vaccine. One dose is recommended after age 36. Talk to your health care provider about which screenings and vaccines you need and how often you need them. This information is not intended to replace advice given to you by your health care provider. Make sure you discuss any questions you have with your health care provider. Document Released: 01/08/2016 Document Revised: 08/31/2016 Document Reviewed: 10/13/2015 Elsevier Interactive Patient Education  2017 [place] Prevention in the Home Falls can cause injuries. They can happen to people of all ages. There are many things you can do to make your home safe and to help prevent falls. What can I do on the outside of my home? Regularly fix the edges of walkways and driveways and fix any cracks. Remove anything that might make you trip as you walk through a door, such as a raised step or threshold. Trim any bushes or trees on the path to your home. Use bright outdoor lighting. Clear any walking paths of anything that might make someone trip, such as rocks or tools. Regularly check to see if handrails are loose or broken. Make sure that both sides of any steps have handrails. Any raised decks and porches should have guardrails on the edges. Have any leaves, snow, or ice cleared regularly. Use sand or salt on walking paths during winter. Clean up any spills in your garage right away. This includes oil or grease spills. What can I do in the bathroom? Use night lights. Install grab bars by the toilet and in the tub and shower. Do not use towel bars as grab bars. Use non-skid mats or decals in the tub or shower. If you need to sit down in the shower, use a plastic, non-slip stool. Keep the floor dry. Clean up any water that spills on the floor as soon  as it happens. Remove soap buildup in the tub or shower regularly. Attach bath mats securely with double-sided non-slip rug tape. Do not have throw rugs and other things on the floor that can make you trip. What can I do in the bedroom? Use night lights. Make sure that you have a light by your bed that is easy to reach. Do not use any sheets or blankets that are too big for your bed. They should not hang down onto the floor. Have a firm chair that has side arms. You can use this for support while you get dressed. Do not have throw rugs and other things on the floor that can make you trip. What can I do in the kitchen? Clean up any spills right away. Avoid walking on wet floors. Keep items that you use a lot in easy-to-reach places. If you need to reach something above you, use a strong step stool that has a grab bar. Keep electrical cords out of the way. Do not use floor polish or wax that makes floors slippery. If you must use wax, use non-skid floor wax. Do not have throw rugs and other things on the floor that can  make you trip. What can I do with my stairs? Do not leave any items on the stairs. Make sure that there are handrails on both sides of the stairs and use them. Fix handrails that are broken or loose. Make sure that handrails are as long as the stairways. Check any carpeting to make sure that it is firmly attached to the stairs. Fix any carpet that is loose or worn. Avoid having throw rugs at the top or bottom of the stairs. If you do have throw rugs, attach them to the floor with carpet tape. Make sure that you have a light switch at the top of the stairs and the bottom of the stairs. If you do not have them, ask someone to add them for you. What else can I do to help prevent falls? Wear shoes that: Do not have high heels. Have rubber bottoms. Are comfortable and fit you well. Are closed at the toe. Do not wear sandals. If you use a stepladder: Make sure that it is fully  opened. Do not climb a closed stepladder. Make sure that both sides of the stepladder are locked into place. Ask someone to hold it for you, if possible. Clearly mark and make sure that you can see: Any grab bars or handrails. First and last steps. Where the edge of each step is. Use tools that help you move around (mobility aids) if they are needed. These include: Canes. Walkers. Scooters. Crutches. Turn on the lights when you go into a dark area. Replace any light bulbs as soon as they burn out. Set up your furniture so you have a clear path. Avoid moving your furniture around. If any of your floors are uneven, fix them. If there are any pets around you, be aware of where they are. Review your medicines with your doctor. Some medicines can make you feel dizzy. This can increase your chance of falling. Ask your doctor what other things that you can do to help prevent falls. This information is not intended to replace advice given to you by your health care provider. Make sure you discuss any questions you have with your health care provider. Document Released: 10/08/2009 Document Revised: 05/19/2016 Document Reviewed: 01/16/2015 Elsevier Interactive Patient Education  2017 [employer].

## 2022-11-30 NOTE — Progress Notes (Signed)
Subjective:   Jason Bartlett is a 66 y.o. male who presents for an Initial Medicare Annual Wellness Visit.  I connected with  Jason Bartlett on 11/30/22 by a telephone enabled telemedicine application and verified that I am speaking with the correct person using two identifiers.   I discussed the limitations of evaluation and management by telemedicine. The patient expressed understanding and agreed to proceed.  Patient location: home  Provider location: in office    Review of Systems     Cardiac Risk Factors include: advanced age (>72mn, >>45women);male gender;hypertension;obesity (BMI >30kg/m2)     Objective:    Today's Vitals   There is no height or weight on file to calculate BMI.     11/30/2022   12:14 PM 02/07/2021    5:30 PM 10/31/2019    9:41 AM 09/12/2018   12:30 PM  Advanced Directives  Does Patient Have a Medical Advance Directive? No No No No  Would patient like information on creating a medical advance directive? No - Patient declined   No - Patient declined    Current Medications (verified) Outpatient Encounter Medications as of 11/30/2022  Medication Sig   acetaminophen (TYLENOL) 500 MG tablet Take 1,000 mg by mouth every 6 (six) hours as needed for moderate pain.   albuterol (VENTOLIN HFA) 108 (90 Base) MCG/ACT inhaler Inhale 2 puffs into the lungs every 6 (six) hours as needed for wheezing or shortness of breath.   allopurinol (ZYLOPRIM) 100 MG tablet TAKE 1 TABLET(100 MG) BY MOUTH DAILY   fluticasone (FLOVENT HFA) 110 MCG/ACT inhaler Inhale 1 puff into the lungs in the morning and at bedtime.   lisinopril-hydrochlorothiazide (ZESTORETIC) 20-12.5 MG tablet Take 1 tablet by mouth daily.   Multiple Vitamins-Minerals (MULTIVITAMIN ADULT) CHEW Chew 1 tablet by mouth daily.    omeprazole (PRILOSEC) 40 MG capsule Take 1 capsule (40 mg total) by mouth daily.   tadalafil (CIALIS) 10 MG tablet Take 1 tablet (10 mg total) by mouth daily.   azithromycin  (ZITHROMAX) 250 MG tablet 2 tabs on day 1, 1 tab on day 2-5   No facility-administered encounter medications on file as of 11/30/2022.    Allergies (verified) Penicillins and Sulfa antibiotics   History: Past Medical History:  Diagnosis Date   GERD (gastroesophageal reflux disease)    History of chicken pox    Hyperlipidemia    Hypertension    [place]    Past Surgical History:  Procedure Laterality Date   EXTERNAL EAR SURGERY     PROSTATE SURGERY     Family History  Problem Relation Age of Onset   Cancer Mother        breast   Dementia Father    Hearing loss Father    Cancer Father        bone cancer   Hearing loss Brother    Early death Paternal Grandfather    Heart attack Paternal Grandfather    Social History   Socioeconomic History   Marital status: Married    Spouse name: Not on file   Number of children: Not on file   Years of education: Not on file   Highest education level: Not on file  Occupational History    Employer: [employer]  Tobacco Use   Smoking status: Never   Smokeless tobacco: Never  Vaping Use   Vaping Use: Never used  Substance and Sexual Activity   Alcohol use: Not Currently    Comment: social beer  Drug use: No   Sexual activity: Yes  Other Topics Concern   Not on file  Social History Narrative   Not on file   Social Determinants of Health   Financial Resource Strain: Low Risk  (11/30/2022)   Overall Financial Resource Strain (CARDIA)    Difficulty of Paying Living Expenses: Not hard at all  Food Insecurity: No Food Insecurity (11/30/2022)   Hunger Vital Sign    Worried About Running Out of Food in the Last Year: Never true    Ran Out of Food in the Last Year: Never true  Transportation Needs: No Transportation Needs (11/30/2022)   PRAPARE - Hydrologist (Medical): No    Lack of Transportation (Non-Medical): No  Physical Activity: Sufficiently Active (11/30/2022)   Exercise  Vital Sign    Days of Exercise per Week: 4 days    Minutes of Exercise per Session: 40 min  Stress: No Stress Concern Present (11/30/2022)   [place]    Feeling of Stress : Not at all  Social Connections: Moderately Integrated (11/30/2022)   Social Connection and Isolation Panel [NHANES]    Frequency of Communication with Friends and Family: More than three times a week    Frequency of Social Gatherings with Friends and Family: Three times a week    Attends Religious Services: Never    Active Member of Clubs or Organizations: Yes    Attends Archivist Meetings: 1 to 4 times per year    Marital Status: Married    Tobacco Counseling Counseling given: Not Answered   Clinical Intake:  Pre-visit preparation completed: Yes  Pain : No/denies pain     Diabetes: No  How often do you need to have someone help you when you read instructions, pamphlets, or other written materials from your doctor or pharmacy?: 1 - Never  Diabetic?  no  Interpreter Needed?: No  Information entered by :: Jason Kennedy LPN   Activities of Daily Living    11/30/2022   12:41 PM 02/25/2022    9:50 AM  In your present state of health, do you have any difficulty performing the following activities:  Hearing? 0 0  Vision? 0 0  Difficulty concentrating or making decisions? 0 0  Walking or climbing stairs? 0 0  Dressing or bathing? 0 0  Doing errands, shopping? 0 0  Preparing Food and eating ? N   Using the Toilet? N   In the past six months, have you accidently leaked urine? N   Do you have problems with loss of bowel control? N   Managing your Medications? N   Managing your Finances? N   Housekeeping or managing your Housekeeping? N     Patient Care Team: Jason Minium, MD as PCP - General (Family Medicine) Bartlett, Jason Furbish, MD as Consulting Physician (Urology) Jason Bartlett, DPM as Consulting Physician  (Podiatry)  Indicate any recent Medical Services you may have received from other than [facility] providers in the past year (date may be approximate).     Assessment:   This is a routine wellness examination for Jason Bartlett.  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  Vision Screening - Comments:: Up to date [facility]   Dietary issues and exercise activities discussed: Current Exercise Habits: Structured exercise class, Type of exercise: strength training/weights;walking, Time (Minutes): 40, Frequency (Times/Week): 3, Weekly Exercise (Minutes/Week): 120, Intensity: Moderate  Goals Addressed             This Visit's Progress    Increase physical activity         Depression Screen    11/30/2022   12:22 PM 08/30/2022    7:59 AM 08/12/2022    7:52 AM 07/25/2022   11:20 AM 02/25/2022    9:53 AM 08/16/2021    3:08 PM 08/16/2021    3:00 PM  PHQ 2/9 Scores  PHQ - 2 Score 0 0 0 0 0 0 0  PHQ- 9 Score 2 0 2 1 0 1     Fall Risk    08/30/2022    7:59 AM 08/12/2022    7:52 AM 07/25/2022   11:20 AM 02/25/2022    9:54 AM 08/16/2021    3:08 PM  Fall Risk   Falls in the past year? 0 0 0 0 0  Number falls in past yr: 0  0    Injury with Fall? 0  0    Risk for fall due to : No Fall Risks No Fall Risks No Fall Risks No Fall Risks   Follow up Falls evaluation completed Falls evaluation completed Falls evaluation completed Falls evaluation completed     [place]:  Any stairs in or around the home? No  If so, are there any without handrails? No  Home free of loose throw rugs in walkways, pet beds, electrical cords, etc? Yes  Adequate lighting in your home to reduce risk of falls? Yes   ASSISTIVE DEVICES UTILIZED TO PREVENT FALLS:  Life alert? No  Use of a cane, walker or w/c? No  Grab bars in the bathroom? Yes  Shower chair or bench in shower? No  Elevated toilet seat or a handicapped toilet? Yes   TIMED  UP AND GO:  Was the test performed? No .    Cognitive Function:        11/30/2022   12:24 PM  6CIT Screen  What Year? 0 points  What month? 0 points  What time? 0 points  Count back from 20 0 points  Months in reverse 0 points  Repeat phrase 0 points  Total Score 0 points    Immunizations Immunization History  Administered Date(s) Administered   Influenza Inj Mdck Quad Pf 10/02/2019   Influenza,inj,Quad PF,6+ Mos 10/08/2017, 09/16/2018   Influenza-Unspecified 09/25/2021   PFIZER(Purple Top)SARS-COV-2 Vaccination 03/04/2020, 03/25/2020   Tdap 02/08/2018   Zoster Recombinat (Shingrix) 03/13/2019, 10/15/2019    TDAP status: Up to date  Flu Vaccine status: Up to date  Pneumococcal vaccine status: Due, Education has been provided regarding the importance of this vaccine. Advised may receive this vaccine at local pharmacy or Health Dept. Aware to provide a copy of the vaccination record if obtained from local pharmacy or Health Dept. Verbalized acceptance and understanding.  Covid-19 vaccine status: Information provided on how to obtain vaccines.   Qualifies for Shingles Vaccine? No   Zostavax completed No   Shingrix Completed?: Yes  Screening Tests Health Maintenance  Topic Date Due   INFLUENZA VACCINE  03/26/2023 (Originally 07/26/2022)   Pneumonia Vaccine 55+ Years old (1 - PCV) 07/26/2023 (Originally 01/22/2019)   Medicare Annual Wellness (AWV)  12/01/2023   DTaP/Tdap/Td (2 - Td or Tdap) 02/09/2028   COLONOSCOPY (Pts 45-30yr Insurance coverage will need to be confirmed)  03/20/2028   Zoster Vaccines- Shingrix  Completed   HPV VACCINES  Aged Out   COVID-19 Vaccine  Discontinued   Hepatitis C Screening  Discontinued    Health Maintenance  There are no preventive care reminders to display for this patient.   Colorectal cancer screening: Type of screening: Colonoscopy. Completed 2019. Repeat every 10 years  Lung Cancer Screening: (Low Dose CT Chest recommended  if Age 44-80 years, 30 pack-year currently smoking OR have quit w/in 15years.) does not qualify.   Lung Cancer Screening Referral:   Additional Screening:  Hepatitis C Screening: does qualify  Vision Screening: Recommended annual ophthalmology exams for early detection of glaucoma and other disorders of the eye. Is the patient up to date with their annual eye exam?  Yes  Who is the provider or what is the name of the office in which the patient attends annual eye exams? [facility] If pt is not established with a provider, would they like to be referred to a provider to establish care? No .   Dental Screening: Recommended annual dental exams for proper oral hygiene  Community Resource Referral / Chronic Care Management: CRR required this visit?  No   CCM required this visit?  No      Plan:     I have personally reviewed and noted the following in the patient's chart:   Medical and social history Use of alcohol, tobacco or illicit drugs  Current medications and supplements including opioid prescriptions. Patient is not currently taking opioid prescriptions. Functional ability and status Nutritional status Physical activity Advanced directives List of other physicians Hospitalizations, surgeries, and ER visits in previous 12 months Vitals Screenings to include cognitive, depression, and falls Referrals and appointments  In addition, I have reviewed and discussed with patient certain preventive protocols, quality metrics, and best practice recommendations. A written personalized care plan for preventive services as well as general preventive health recommendations were provided to patient.     Jason Kennedy, LPN   [id number]   Nurse Notes:

## 2022-12-04 DIAGNOSIS — Z20822 Contact with and (suspected) exposure to covid-19: Secondary | ICD-10-CM | POA: Diagnosis not present

## 2022-12-04 DIAGNOSIS — R509 Fever, unspecified: Secondary | ICD-10-CM | POA: Diagnosis not present

## 2022-12-04 DIAGNOSIS — R0981 Nasal congestion: Secondary | ICD-10-CM | POA: Diagnosis not present

## 2022-12-04 DIAGNOSIS — J029 Acute pharyngitis, unspecified: Secondary | ICD-10-CM | POA: Diagnosis not present

## 2022-12-29 DIAGNOSIS — H524 Presbyopia: Secondary | ICD-10-CM | POA: Diagnosis not present

## 2022-12-29 DIAGNOSIS — H35352 Cystoid macular degeneration, left eye: Secondary | ICD-10-CM | POA: Diagnosis not present

## 2023-01-05 DIAGNOSIS — C44619 Basal cell carcinoma of skin of left upper limb, including shoulder: Secondary | ICD-10-CM | POA: Diagnosis not present

## 2023-01-05 DIAGNOSIS — L918 Other hypertrophic disorders of the skin: Secondary | ICD-10-CM | POA: Diagnosis not present

## 2023-01-05 DIAGNOSIS — L820 Inflamed seborrheic keratosis: Secondary | ICD-10-CM | POA: Diagnosis not present

## 2023-01-05 DIAGNOSIS — C44519 Basal cell carcinoma of skin of other part of trunk: Secondary | ICD-10-CM | POA: Diagnosis not present

## 2023-02-08 ENCOUNTER — Other Ambulatory Visit: Payer: Self-pay | Admitting: Family Medicine

## 2023-02-08 DIAGNOSIS — I10 Essential (primary) hypertension: Secondary | ICD-10-CM

## 2023-02-28 ENCOUNTER — Ambulatory Visit (INDEPENDENT_AMBULATORY_CARE_PROVIDER_SITE_OTHER): Payer: Medicare Other | Admitting: Family Medicine

## 2023-02-28 ENCOUNTER — Encounter: Payer: Self-pay | Admitting: Family Medicine

## 2023-02-28 ENCOUNTER — Ambulatory Visit (HOSPITAL_BASED_OUTPATIENT_CLINIC_OR_DEPARTMENT_OTHER)
Admission: RE | Admit: 2023-02-28 | Discharge: 2023-02-28 | Disposition: A | Discharge status: Home / Self Care | Payer: Medicare Other | Source: Ambulatory Visit | Attending: Family Medicine | Admitting: Family Medicine

## 2023-02-28 VITALS — BP 128/82 | HR 95 | Temp 98.0°F | Resp 17 | Ht 70.5 in | Wt 271.2 lb

## 2023-02-28 DIAGNOSIS — R221 Localized swelling, mass and lump, neck: Secondary | ICD-10-CM | POA: Diagnosis not present

## 2023-02-28 DIAGNOSIS — Z0000 Encounter for general adult medical examination without abnormal findings: Secondary | ICD-10-CM | POA: Diagnosis not present

## 2023-02-28 DIAGNOSIS — Z8546 Personal history of malignant neoplasm of prostate: Secondary | ICD-10-CM | POA: Insufficient documentation

## 2023-02-28 DIAGNOSIS — R222 Localized swelling, mass and lump, trunk: Secondary | ICD-10-CM

## 2023-02-28 LAB — CBC WITH DIFFERENTIAL/PLATELET
Basophils Absolute: 0 10*3/uL (ref 0.0–0.1)
Basophils Relative: 0.5 % (ref 0.0–3.0)
Eosinophils Absolute: 0.3 10*3/uL (ref 0.0–0.7)
Eosinophils Relative: 4 % (ref 0.0–5.0)
HCT: 44.3 % (ref 39.0–52.0)
Hemoglobin: 15.1 g/dL (ref 13.0–17.0)
Lymphocytes Relative: 19 % (ref 12.0–46.0)
Lymphs Abs: 1.6 10*3/uL (ref 0.7–4.0)
MCHC: 34 g/dL (ref 30.0–36.0)
MCV: 90.3 fl (ref 78.0–100.0)
Monocytes Absolute: 0.7 10*3/uL (ref 0.1–1.0)
Monocytes Relative: 8.9 % (ref 3.0–12.0)
Neutro Abs: 5.5 10*3/uL (ref 1.4–7.7)
Neutrophils Relative %: 67.6 % (ref 43.0–77.0)
Platelets: 153 10*3/uL (ref 150.0–400.0)
RBC: 4.91 Mil/uL (ref 4.22–5.81)
RDW: 14.4 % (ref 11.5–15.5)
WBC: 8.2 10*3/uL (ref 4.0–10.5)

## 2023-02-28 LAB — BASIC METABOLIC PANEL
BUN: 18 mg/dL (ref 6–23)
CO2: 28 mEq/L (ref 19–32)
Calcium: 9.7 mg/dL (ref 8.4–10.5)
Chloride: 96 mEq/L (ref 96–112)
Creatinine, Ser: 1.17 mg/dL (ref 0.40–1.50)
GFR: 63.79 mL/min (ref >60.00)
Glucose, Bld: 107 mg/dL — ABNORMAL HIGH (ref 70–99)
Potassium: 4.4 mEq/L (ref 3.5–5.1)
Sodium: 135 mEq/L (ref 135–145)

## 2023-02-28 LAB — HEPATIC FUNCTION PANEL
ALT: 42 U/L (ref 0–53)
AST: 35 U/L (ref 0–37)
Albumin: 4.3 g/dL (ref 3.5–5.2)
Alkaline Phosphatase: 43 U/L (ref 39–117)
Bilirubin, Direct: 0.2 mg/dL (ref 0.0–0.3)
Total Bilirubin: 1 mg/dL (ref 0.2–1.2)
Total Protein: 7.5 g/dL (ref 6.0–8.3)

## 2023-02-28 LAB — TSH: TSH: 2.78 u[IU]/mL (ref 0.35–5.50)

## 2023-02-28 LAB — LIPID PANEL
Cholesterol: 170 mg/dL (ref 0–200)
HDL: 31.6 mg/dL — ABNORMAL LOW (ref >39.00)
LDL Cholesterol: 101 mg/dL — ABNORMAL HIGH (ref 0–99)
NonHDL: 138.73
Total CHOL/HDL Ratio: 5
Triglycerides: 191 mg/dL — ABNORMAL HIGH (ref 0.0–149.0)
VLDL: 38.2 mg/dL (ref 0.0–40.0)

## 2023-02-28 LAB — PSA, MEDICARE: PSA: 4.7 ng/ml — ABNORMAL HIGH (ref 0.10–4.00)

## 2023-02-28 NOTE — Assessment & Plan Note (Signed)
Pt's BMI of 38.37 is considered morbidly obese b/c of his HTN and hyperlipidemia.  Check labs to risk stratify.  Encouraged healthy diet and regular exercise.  Will follow.

## 2023-02-28 NOTE — Assessment & Plan Note (Addendum)
Follows w/ urology.  Check PSA at pt's request

## 2023-02-28 NOTE — Progress Notes (Signed)
   Subjective:    Patient ID: Jason Bartlett, male    DOB: 1956/10/26, 67 y.o.   MRN: [id number]  HPI CPE- UTD on Tdap, colonoscopy, flu.  Pt reports he had the PNA shot at [facility]  Patient Care Team    Relationship Specialty Notifications Start End  Midge Minium, MD PCP - General Family Medicine  06/29/21   Cleon Gustin, MD Consulting Physician Urology  02/16/21   Wallene Huh, DPM Consulting Physician Podiatry  02/16/21      Health Maintenance  Topic Date Due   Pneumonia Vaccine 46+ Years old (1 of 1 - PCV) 07/26/2023 (Originally 01/22/2019)   Medicare Annual Wellness (AWV)  12/01/2023   DTaP/Tdap/Td (2 - Td or Tdap) 02/09/2028   COLONOSCOPY (Pts 45-77yr Insurance coverage will need to be confirmed)  03/20/2028   INFLUENZA VACCINE  Completed   Zoster Vaccines- Shingrix  Completed   HPV VACCINES  Aged Out   COVID-19 Vaccine  Discontinued   Hepatitis C Screening  Discontinued      Review of Systems Patient reports no vision/hearing changes, anorexia, fever ,adenopathy, persistant/recurrent hoarseness, swallowing issues, chest pain, palpitations, edema, persistant/recurrent cough, hemoptysis, dyspnea (rest,exertional, paroxysmal nocturnal), gastrointestinal  bleeding (melena, rectal bleeding), abdominal pain, excessive heart burn, GU symptoms (dysuria, hematuria, voiding/incontinence issues) syncope, focal weakness, memory loss, numbness & tingling, skin/hair/nail changes, depression, anxiety, abnormal bruising/bleeding, musculoskeletal symptoms/signs.     Objective:   Physical Exam General Appearance:    Alert, cooperative, no distress, appears stated age, obese  Head:    Normocephalic, without obvious abnormality, atraumatic  Eyes:    PERRL, conjunctiva/corneas clear, EOM's intact both eyes       Ears:    Normal TM's and external ear canals, both ears  Nose:   Nares normal, septum midline, mucosa normal, no drainage   or sinus tenderness  Throat:   Lips,  mucosa, and tongue normal; teeth and gums normal  Neck:   Supple, trachea midline, no adenopathy, R supraclavicular fossa fullness     thyroid:  No enlargement/tenderness/nodules  Back:     Symmetric, no curvature, ROM normal, no CVA tenderness  Lungs:     Clear to auscultation bilaterally, respirations unlabored  Chest wall:    No tenderness or deformity  Heart:    Regular rate and rhythm, S1 and S2 normal, no murmur, rub   or gallop  Abdomen:     Soft, non-tender, bowel sounds active all four quadrants,    no masses, no organomegaly  Genitalia:    deferred  Rectal:    Extremities:   Extremities normal, atraumatic, no cyanosis or edema  Pulses:   2+ and symmetric all extremities  Skin:   Skin color, texture, turgor normal, no rashes or lesions  Lymph nodes:   Cervical, supraclavicular, and axillary nodes normal  Neurologic:   CNII-XII intact. Normal strength, sensation and reflexes      throughout          Assessment & Plan:

## 2023-02-28 NOTE — Patient Instructions (Signed)
Follow up in 6 months to recheck BP and cholesterol We'll notify you of your lab results and make any changes if needed We'll call you to schedule your ultrasound Continue to work on healthy diet and regular exercise- you can do it!! Call with any questions or concerns Stay Safe!  Stay Healthy! Enjoy the [facility]!!!

## 2023-02-28 NOTE — Assessment & Plan Note (Signed)
Pt's PE WNL w/ exception of obesity and R supraclavicular fossa fullness (will get [place] to assess) UTD on colonoscopy, Tdap, flu.  Pt reports he had PNA vaccine at pharmacy.  Check labs.  Anticipatory guidance provided.

## 2023-03-01 ENCOUNTER — Telehealth: Payer: Self-pay

## 2023-03-01 NOTE — Telephone Encounter (Signed)
-----   Message from Midge Minium, MD sent at 03/01/2023  7:11 AM EST ----- Labs look good w/ exception of PSA which is elevated at 4.7  Based on this, you need to call and schedule an appt w/ your Urologist ASAP to determine the next steps  Total cholesterol and LDL (bad cholesterol) have both increased since last check.  No medication needed but please work on a healthy diet and regular exercise to improve these numbers.

## 2023-03-01 NOTE — Telephone Encounter (Signed)
Informed pt of lab results  

## 2023-03-14 DIAGNOSIS — H53143 Visual discomfort, bilateral: Secondary | ICD-10-CM | POA: Diagnosis not present

## 2023-03-14 DIAGNOSIS — H52223 Regular astigmatism, bilateral: Secondary | ICD-10-CM | POA: Diagnosis not present

## 2023-03-14 DIAGNOSIS — H5212 Myopia, left eye: Secondary | ICD-10-CM | POA: Diagnosis not present

## 2023-03-14 DIAGNOSIS — H5201 Hypermetropia, right eye: Secondary | ICD-10-CM | POA: Diagnosis not present

## 2023-03-14 DIAGNOSIS — H524 Presbyopia: Secondary | ICD-10-CM | POA: Diagnosis not present

## 2023-05-15 DIAGNOSIS — H353131 Nonexudative age-related macular degeneration, bilateral, early dry stage: Secondary | ICD-10-CM | POA: Diagnosis not present

## 2023-07-10 DIAGNOSIS — H2513 Age-related nuclear cataract, bilateral: Secondary | ICD-10-CM | POA: Diagnosis not present

## 2023-07-10 DIAGNOSIS — H353221 Exudative age-related macular degeneration, left eye, with active choroidal neovascularization: Secondary | ICD-10-CM | POA: Diagnosis not present

## 2023-07-11 ENCOUNTER — Encounter (INDEPENDENT_AMBULATORY_CARE_PROVIDER_SITE_OTHER): Payer: 59 | Admitting: Ophthalmology

## 2023-07-11 DIAGNOSIS — H35033 Hypertensive retinopathy, bilateral: Secondary | ICD-10-CM | POA: Diagnosis not present

## 2023-07-11 DIAGNOSIS — H43813 Vitreous degeneration, bilateral: Secondary | ICD-10-CM

## 2023-07-11 DIAGNOSIS — H2513 Age-related nuclear cataract, bilateral: Secondary | ICD-10-CM | POA: Diagnosis not present

## 2023-07-11 DIAGNOSIS — I10 Essential (primary) hypertension: Secondary | ICD-10-CM

## 2023-07-11 DIAGNOSIS — H353112 Nonexudative age-related macular degeneration, right eye, intermediate dry stage: Secondary | ICD-10-CM | POA: Diagnosis not present

## 2023-07-11 DIAGNOSIS — H353221 Exudative age-related macular degeneration, left eye, with active choroidal neovascularization: Secondary | ICD-10-CM | POA: Diagnosis not present

## 2023-07-20 ENCOUNTER — Other Ambulatory Visit: Payer: Self-pay

## 2023-07-20 DIAGNOSIS — N529 Male erectile dysfunction, unspecified: Secondary | ICD-10-CM

## 2023-07-20 MED ORDER — TADALAFIL 10 MG PO TABS: 10.0000 mg | ORAL_TABLET | Freq: Every day | ORAL | 6 refills | Status: DC

## 2023-08-10 ENCOUNTER — Encounter (INDEPENDENT_AMBULATORY_CARE_PROVIDER_SITE_OTHER): Payer: Self-pay

## 2023-08-10 ENCOUNTER — Other Ambulatory Visit: Payer: Self-pay | Admitting: Family Medicine

## 2023-08-10 DIAGNOSIS — I10 Essential (primary) hypertension: Secondary | ICD-10-CM

## 2023-08-11 ENCOUNTER — Encounter (INDEPENDENT_AMBULATORY_CARE_PROVIDER_SITE_OTHER): Payer: Medicare Other | Admitting: Ophthalmology

## 2023-08-11 DIAGNOSIS — H35033 Hypertensive retinopathy, bilateral: Secondary | ICD-10-CM

## 2023-08-11 DIAGNOSIS — I10 Essential (primary) hypertension: Secondary | ICD-10-CM | POA: Diagnosis not present

## 2023-08-11 DIAGNOSIS — H43813 Vitreous degeneration, bilateral: Secondary | ICD-10-CM

## 2023-08-11 DIAGNOSIS — H353112 Nonexudative age-related macular degeneration, right eye, intermediate dry stage: Secondary | ICD-10-CM | POA: Diagnosis not present

## 2023-08-11 DIAGNOSIS — H353221 Exudative age-related macular degeneration, left eye, with active choroidal neovascularization: Secondary | ICD-10-CM

## 2023-08-18 DIAGNOSIS — J9801 Acute bronchospasm: Secondary | ICD-10-CM | POA: Diagnosis not present

## 2023-08-30 ENCOUNTER — Ambulatory Visit (INDEPENDENT_AMBULATORY_CARE_PROVIDER_SITE_OTHER): Payer: Medicare Other | Admitting: *Deleted

## 2023-08-30 DIAGNOSIS — Z0000 Encounter for general adult medical examination without abnormal findings: Secondary | ICD-10-CM | POA: Diagnosis not present

## 2023-08-30 DIAGNOSIS — Z1211 Encounter for screening for malignant neoplasm of colon: Secondary | ICD-10-CM

## 2023-08-30 NOTE — Progress Notes (Signed)
Subjective:   Jason Bartlett is a 67 y.o. male who presents for Medicare Annual/Subsequent preventive examination.  Visit Complete: Virtual  I connected with  Jason Bartlett on 08/30/23 by a audio enabled telemedicine application and verified that I am speaking with the correct person using two identifiers.  Patient Location: Home  Provider Location: Home Office  I discussed the limitations of evaluation and management by telemedicine. The patient expressed understanding and agreed to proceed.  Patient Medicare AWV questionnaire was completed by the patient on 08-26-2023; I have confirmed that all information answered by patient is correct and no changes since this date.  Vital Signs: Unable to obtain new vitals due to this being a telehealth visit.   Review of Systems     Cardiac Risk Factors include: advanced age (>41men, >65 women);male gender;family history of premature cardiovascular disease;hypertension     Objective:    There were no vitals filed for this visit. There is no height or weight on file to calculate BMI.     08/30/2023    9:32 AM 11/30/2022   12:14 PM 02/07/2021    5:30 PM 10/31/2019    9:41 AM 09/12/2018   12:30 PM  Advanced Directives  Does Patient Have a Medical Advance Directive? Yes No No No No  Type of Advance Directive Healthcare Power of Attorney      Does patient want to make changes to medical advance directive? No - Patient declined      Copy of Healthcare Power of Attorney in Chart? No - copy requested      Would patient like information on creating a medical advance directive?  No - Patient declined   No - Patient declined    Current Medications (verified) Outpatient Encounter Medications as of 08/30/2023  Medication Sig   acetaminophen (TYLENOL) 500 MG tablet Take 1,000 mg by mouth every 6 (six) hours as needed for moderate pain.   albuterol (VENTOLIN HFA) 108 (90 Base) MCG/ACT inhaler Inhale 2 puffs into the lungs every 6 (six) hours as  needed for wheezing or shortness of breath.   allopurinol (ZYLOPRIM) 100 MG tablet TAKE 1 TABLET(100 MG) BY MOUTH DAILY   fluticasone (FLOVENT HFA) 110 MCG/ACT inhaler Inhale 1 puff into the lungs in the morning and at bedtime.   lisinopril-hydrochlorothiazide (ZESTORETIC) 20-12.5 MG tablet TAKE 1 TABLET BY MOUTH DAILY   omeprazole (PRILOSEC) 40 MG capsule TAKE 1 CAPSULE(40 MG) BY MOUTH DAILY   tadalafil (CIALIS) 10 MG tablet Take 1 tablet (10 mg total) by mouth daily.   Multiple Vitamins-Minerals (MULTIVITAMIN ADULT) CHEW Chew 1 tablet by mouth daily.    No facility-administered encounter medications on file as of 08/30/2023.    Allergies (verified) Penicillins and Sulfa antibiotics   History: Past Medical History:  Diagnosis Date   GERD (gastroesophageal reflux disease)    History of chicken pox    Hyperlipidemia    Hypertension    Prostate cancer (HCC)    Past Surgical History:  Procedure Laterality Date   EXTERNAL EAR SURGERY     PROSTATE SURGERY     Family History  Problem Relation Age of Onset   Cancer Mother        breast   Dementia Father    Hearing loss Father    Cancer Father        bone cancer   Hearing loss Brother    Early death Paternal Grandfather    Heart attack Paternal Grandfather    Social History  Socioeconomic History   Marital status: Married    Spouse name: Not on file   Number of children: Not on file   Years of education: Not on file   Highest education level: Not on file  Occupational History    Employer: [employer]  Tobacco Use   Smoking status: Never   Smokeless tobacco: Never  Vaping Use   Vaping status: Never Used  Substance and Sexual Activity   Alcohol use: Not Currently    Comment: social beer   Drug use: No   Sexual activity: Yes  Other Topics Concern   Not on file  Social History Narrative   Not on file   Social Determinants of Health   Financial Resource Strain: Low Risk  (08/30/2023)   Overall Financial  Resource Strain (CARDIA)    Difficulty of Paying Living Expenses: Not hard at all  Food Insecurity: No Food Insecurity (08/30/2023)   Hunger Vital Sign    Worried About Running Out of Food in the Last Year: Never true    Ran Out of Food in the Last Year: Never true  Transportation Needs: No Transportation Needs (08/30/2023)   PRAPARE - Administrator, Civil Service (Medical): No    Lack of Transportation (Non-Medical): No  Physical Activity: Insufficiently Active (08/30/2023)   Exercise Vital Sign    Days of Exercise per Week: 2 days    Minutes of Exercise per Session: 30 min  Stress: No Stress Concern Present (08/30/2023)   [employer] of Occupational Health - Occupational Stress Questionnaire    Feeling of Stress : Not at all  Social Connections: Moderately Integrated (08/30/2023)   Social Connection and Isolation Panel [NHANES]    Frequency of Communication with Friends and Family: More than three times a week    Frequency of Social Gatherings with Friends and Family: Three times a week    Attends Religious Services: Never    Active Member of Clubs or Organizations: Yes    Attends Engineer, structural: More than 4 times per year    Marital Status: Living with partner    Tobacco Counseling Counseling given: Not Answered   Clinical Intake:              How often do you need to have someone help you when you read instructions, pamphlets, or other written materials from your doctor or pharmacy?: (P) 1 - Never         Activities of Daily Living    08/30/2023    9:06 AM 08/26/2023   11:49 AM  In your present state of health, do you have any difficulty performing the following activities:  Hearing? 0 0  Vision? 0 0  Difficulty concentrating or making decisions? 0 0  Walking or climbing stairs? 0 0  Dressing or bathing? 0 0  Doing errands, shopping? 0 0  Preparing Food and eating ?  N  Using the Toilet? N N  In the past six months, have you  accidently leaked urine? N N  Do you have problems with loss of bowel control? N N  Managing your Medications? N N  Managing your Finances? N N  Housekeeping or managing your Housekeeping? N N    Patient Care Team: Sheliah Hatch, MD as PCP - General (Family Medicine) Ronne Binning Mardene Celeste, MD as Consulting Physician (Urology) Charlsie Merles Kirstie Peri, DPM as Consulting Physician (Podiatry)  Indicate any recent Medical Services you may have received from other than [facility] providers  in the past year (date may be approximate).     Assessment:   This is a routine wellness examination for Heitor.  Hearing/Vision screen Hearing Screening - Comments:: No trouble hearing Vision Screening - Comments:: Up to date  Dietary issues and exercise activities discussed:     Goals Addressed             This Visit's Progress    Increase physical activity         Depression Screen    08/30/2023    9:08 AM 02/28/2023    7:51 AM 11/30/2022   12:22 PM 08/30/2022    7:59 AM 08/12/2022    7:52 AM 07/25/2022   11:20 AM 02/25/2022    9:53 AM  PHQ 2/9 Scores  PHQ - 2 Score 0 0 0 0 0 0 0  PHQ- 9 Score 1 0 2 0 2 1 0    Fall Risk    08/30/2023    9:05 AM 08/30/2023    9:04 AM 08/26/2023   11:49 AM 02/28/2023    7:51 AM 08/30/2022    7:59 AM  Fall Risk   Falls in the past year? 0 0 0 0 0  Number falls in past yr:  0 0 0 0  Injury with Fall?  0  0 0  Risk for fall due to :    No Fall Risks No Fall Risks  Follow up  Falls evaluation completed;Education provided;Falls prevention discussed  Falls evaluation completed Falls evaluation completed    MEDICARE RISK AT HOME: Medicare Risk at Home Any stairs in or around the home?: Yes If so, are there any without handrails?: Yes Home free of loose throw rugs in walkways, pet beds, electrical cords, etc?: Yes Adequate lighting in your home to reduce risk of falls?: Yes Life alert?: No Use of a cane, walker or w/c?: No Grab bars in the bathroom?: No Shower  chair or bench in shower?: No Elevated toilet seat or a handicapped toilet?: No  TIMED UP AND GO:  Was the test performed?  No    Cognitive Function:        08/30/2023    9:06 AM 11/30/2022   12:24 PM  6CIT Screen  What Year? 0 points 0 points  What month? 0 points 0 points  What time? 0 points 0 points  Count back from 20 0 points 0 points  Months in reverse 2 points 0 points  Repeat phrase 2 points 0 points  Total Score 4 points 0 points    Immunizations Immunization History  Administered Date(s) Administered   Influenza Inj Mdck Quad Pf 10/02/2019   Influenza,inj,Quad PF,6+ Mos 10/08/2017, 09/16/2018   Influenza,trivalent, recombinat, inj, PF 10/06/2022   Influenza-Unspecified 09/25/2021   PFIZER(Purple Top)SARS-COV-2 Vaccination 03/04/2020, 03/25/2020   Tdap 02/08/2018   Zoster Recombinant(Shingrix) 03/13/2019, 10/15/2019    TDAP status: Up to date  Flu Vaccine status: Due, Education has been provided regarding the importance of this vaccine. Advised may receive this vaccine at local pharmacy or Health Dept. Aware to provide a copy of the vaccination record if obtained from local pharmacy or Health Dept. Verbalized acceptance and understanding.  Pneumococcal vaccine status: Due, Education has been provided regarding the importance of this vaccine. Advised may receive this vaccine at local pharmacy or Health Dept. Aware to provide a copy of the vaccination record if obtained from local pharmacy or Health Dept. Verbalized acceptance and understanding.  Covid-19 vaccine status: Information provided on how to obtain vaccines.  Qualifies for Shingles Vaccine? No   Zostavax completed Yes   Shingrix Completed?: Yes  Screening Tests Health Maintenance  Topic Date Due   Pneumonia Vaccine 69+ Years old (1 of 1 - PCV) Never done   INFLUENZA VACCINE  07/27/2023   Medicare Annual Wellness (AWV)  08/29/2024   DTaP/Tdap/Td (2 - Td or Tdap) 02/09/2028   Colonoscopy   03/20/2028   Zoster Vaccines- Shingrix  Completed   HPV VACCINES  Aged Out   COVID-19 Vaccine  Discontinued   Hepatitis C Screening  Discontinued    Health Maintenance  Health Maintenance Due  Topic Date Due   Pneumonia Vaccine 33+ Years old (1 of 1 - PCV) Never done   INFLUENZA VACCINE  07/27/2023    Colorectal cancer screening: Referral to GI placed  . Pt aware the office will call re: appt.  Lung Cancer Screening: (Low Dose CT Chest recommended if Age 34-80 years, 20 pack-year currently smoking OR have quit w/in 15years.) does not qualify.   Lung Cancer Screening Referral:   Additional Screening:  Hepatitis C Screening never done  Vision Screening: Recommended annual ophthalmology exams for early detection of glaucoma and other disorders of the eye. Is the patient up to date with their annual eye exam?  Yes  Who is the provider or what is the name of the office in which the patient attends annual eye exams? Unsure of name If pt is not established with a provider, would they like to be referred to a provider to establish care? No .   Dental Screening: Recommended annual dental exams for proper oral hygiene    Community Resource Referral / Chronic Care Management: CRR required this visit?  No   CCM required this visit?  No     Plan:     I have personally reviewed and noted the following in the patient's chart:   Medical and social history Use of alcohol, tobacco or illicit drugs  Current medications and supplements including opioid prescriptions. Patient is not currently taking opioid prescriptions. Functional ability and status Nutritional status Physical activity Advanced directives List of other physicians Hospitalizations, surgeries, and ER visits in previous 12 months Vitals Screenings to include cognitive, depression, and falls Referrals and appointments  In addition, I have reviewed and discussed with patient certain preventive protocols, quality  metrics, and best practice recommendations. A written personalized care plan for preventive services as well as general preventive health recommendations were provided to patient.     Remi Haggard, LPN   4/0/9811   After Visit Summary: (MyChart) Due to this being a telephonic visit, the after visit summary with patients personalized plan was offered to patient via MyChart   Nurse Notes:

## 2023-08-30 NOTE — Patient Instructions (Signed)
Mr. Jason Bartlett , Thank you for taking time to come for your Medicare Wellness Visit. I appreciate your ongoing commitment to your health goals. Please review the following plan we discussed and let me know if I can assist you in the future.   Screening recommendations/referrals: Colonoscopy:  Recommended yearly ophthalmology/optometry visit for glaucoma screening and checkup Recommended yearly dental visit for hygiene and checkup  Vaccinations: Influenza vaccine: Education provided Pneumococcal vaccine:  Tdap vaccine: up to date Shingles vaccine:        Preventive Care 65 Years and Older, Male Preventive care refers to lifestyle choices and visits with your health care provider that can promote health and wellness. What does preventive care include? A yearly physical exam. This is also called an annual well check. Dental exams once or twice a year. Routine eye exams. Ask your health care provider how often you should have your eyes checked. Personal lifestyle choices, including: Daily care of your teeth and gums. Regular physical activity. Eating a healthy diet. Avoiding tobacco and drug use. Limiting alcohol use. Practicing safe sex. Taking low doses of aspirin every day. Taking vitamin and mineral supplements as recommended by your health care provider. What happens during an annual well check? The services and screenings done by your health care provider during your annual well check will depend on your age, overall health, lifestyle risk factors, and family history of disease. Counseling  Your health care provider may ask you questions about your: Alcohol use. Tobacco use. Drug use. Emotional well-being. Home and relationship well-being. Sexual activity. Eating habits. History of falls. Memory and ability to understand (cognition). Work and work Astronomer. Screening  You may have the following tests or measurements: Height, weight, and BMI. Blood pressure. Lipid  and cholesterol levels. These may be checked every 5 years, or more frequently if you are over 25 years old. Skin check. Lung cancer screening. You may have this screening every year starting at age 71 if you have a 30-pack-year history of smoking and currently smoke or have quit within the past 15 years. Fecal occult blood test (FOBT) of the stool. You may have this test every year starting at age 51. Flexible sigmoidoscopy or colonoscopy. You may have a sigmoidoscopy every 5 years or a colonoscopy every 10 years starting at age 5. Prostate cancer screening. Recommendations will vary depending on your family history and other risks. Hepatitis C blood test. Hepatitis B blood test. Sexually transmitted disease (STD) testing. Diabetes screening. This is done by checking your blood sugar (glucose) after you have not eaten for a while (fasting). You may have this done every 1-3 years. Abdominal aortic aneurysm (AAA) screening. You may need this if you are a current or former smoker. Osteoporosis. You may be screened starting at age 43 if you are at high risk. Talk with your health care provider about your test results, treatment options, and if necessary, the need for more tests. Vaccines  Your health care provider may recommend certain vaccines, such as: Influenza vaccine. This is recommended every year. Tetanus, diphtheria, and acellular pertussis (Tdap, Td) vaccine. You may need a Td booster every 10 years. Zoster vaccine. You may need this after age 70. Pneumococcal 13-valent conjugate (PCV13) vaccine. One dose is recommended after age 61. Pneumococcal polysaccharide (PPSV23) vaccine. One dose is recommended after age 45. Talk to your health care provider about which screenings and vaccines you need and how often you need them. This information is not intended to replace advice given to you  by your health care provider. Make sure you discuss any questions you have with your health care  provider. Document Released: 01/08/2016 Document Revised: 08/31/2016 Document Reviewed: 10/13/2015 Elsevier Interactive Patient Education  2017 [employer].  Fall Prevention in the Home Falls can cause injuries. They can happen to people of all ages. There are many things you can do to make your home safe and to help prevent falls. What can I do on the outside of my home? Regularly fix the edges of walkways and driveways and fix any cracks. Remove anything that might make you trip as you walk through a door, such as a raised step or threshold. Trim any bushes or trees on the path to your home. Use bright outdoor lighting. Clear any walking paths of anything that might make someone trip, such as rocks or tools. Regularly check to see if handrails are loose or broken. Make sure that both sides of any steps have handrails. Any raised decks and porches should have guardrails on the edges. Have any leaves, snow, or ice cleared regularly. Use sand or salt on walking paths during winter. Clean up any spills in your garage right away. This includes oil or grease spills. What can I do in the bathroom? Use night lights. Install grab bars by the toilet and in the tub and shower. Do not use towel bars as grab bars. Use non-skid mats or decals in the tub or shower. If you need to sit down in the shower, use a plastic, non-slip stool. Keep the floor dry. Clean up any water that spills on the floor as soon as it happens. Remove soap buildup in the tub or shower regularly. Attach bath mats securely with double-sided non-slip rug tape. Do not have throw rugs and other things on the floor that can make you trip. What can I do in the bedroom? Use night lights. Make sure that you have a light by your bed that is easy to reach. Do not use any sheets or blankets that are too big for your bed. They should not hang down onto the floor. Have a firm chair that has side arms. You can use this for support while  you get dressed. Do not have throw rugs and other things on the floor that can make you trip. What can I do in the kitchen? Clean up any spills right away. Avoid walking on wet floors. Keep items that you use a lot in easy-to-reach places. If you need to reach something above you, use a strong step stool that has a grab bar. Keep electrical cords out of the way. Do not use floor polish or wax that makes floors slippery. If you must use wax, use non-skid floor wax. Do not have throw rugs and other things on the floor that can make you trip. What can I do with my stairs? Do not leave any items on the stairs. Make sure that there are handrails on both sides of the stairs and use them. Fix handrails that are broken or loose. Make sure that handrails are as long as the stairways. Check any carpeting to make sure that it is firmly attached to the stairs. Fix any carpet that is loose or worn. Avoid having throw rugs at the top or bottom of the stairs. If you do have throw rugs, attach them to the floor with carpet tape. Make sure that you have a light switch at the top of the stairs and the bottom of the stairs. If  you do not have them, ask someone to add them for you. What else can I do to help prevent falls? Wear shoes that: Do not have high heels. Have rubber bottoms. Are comfortable and fit you well. Are closed at the toe. Do not wear sandals. If you use a stepladder: Make sure that it is fully opened. Do not climb a closed stepladder. Make sure that both sides of the stepladder are locked into place. Ask someone to hold it for you, if possible. Clearly mark and make sure that you can see: Any grab bars or handrails. First and last steps. Where the edge of each step is. Use tools that help you move around (mobility aids) if they are needed. These include: Canes. Walkers. Scooters. Crutches. Turn on the lights when you go into a dark area. Replace any light bulbs as soon as they burn  out. Set up your furniture so you have a clear path. Avoid moving your furniture around. If any of your floors are uneven, fix them. If there are any pets around you, be aware of where they are. Review your medicines with your doctor. Some medicines can make you feel dizzy. This can increase your chance of falling. Ask your doctor what other things that you can do to help prevent falls. This information is not intended to replace advice given to you by your health care provider. Make sure you discuss any questions you have with your health care provider. Document Released: 10/08/2009 Document Revised: 05/19/2016 Document Reviewed: 01/16/2015 Elsevier Interactive Patient Education  2017 [employer].

## 2023-08-31 ENCOUNTER — Ambulatory Visit (INDEPENDENT_AMBULATORY_CARE_PROVIDER_SITE_OTHER): Payer: Medicare Other | Admitting: Family Medicine

## 2023-08-31 ENCOUNTER — Encounter: Payer: Self-pay | Admitting: Family Medicine

## 2023-08-31 VITALS — BP 126/82 | HR 103 | Temp 98.3°F | Resp 18 | Ht 70.5 in | Wt 268.0 lb

## 2023-08-31 DIAGNOSIS — I10 Essential (primary) hypertension: Secondary | ICD-10-CM | POA: Diagnosis not present

## 2023-08-31 DIAGNOSIS — E782 Mixed hyperlipidemia: Secondary | ICD-10-CM | POA: Diagnosis not present

## 2023-08-31 DIAGNOSIS — Z23 Encounter for immunization: Secondary | ICD-10-CM | POA: Diagnosis not present

## 2023-08-31 DIAGNOSIS — R053 Chronic cough: Secondary | ICD-10-CM | POA: Diagnosis not present

## 2023-08-31 LAB — CBC WITH DIFFERENTIAL/PLATELET
Basophils Absolute: 0.1 10*3/uL (ref 0.0–0.1)
Basophils Relative: 1.2 % (ref 0.0–3.0)
Eosinophils Absolute: 0.3 10*3/uL (ref 0.0–0.7)
Eosinophils Relative: 4.1 % (ref 0.0–5.0)
HCT: 42.9 % (ref 39.0–52.0)
Hemoglobin: 14 g/dL (ref 13.0–17.0)
Lymphocytes Relative: 15.9 % (ref 12.0–46.0)
Lymphs Abs: 1.3 10*3/uL (ref 0.7–4.0)
MCHC: 32.6 g/dL (ref 30.0–36.0)
MCV: 92.8 fl (ref 78.0–100.0)
Monocytes Absolute: 0.6 10*3/uL (ref 0.1–1.0)
Monocytes Relative: 6.9 % (ref 3.0–12.0)
Neutro Abs: 6.1 10*3/uL (ref 1.4–7.7)
Neutrophils Relative %: 71.9 % (ref 43.0–77.0)
Platelets: 181 10*3/uL (ref 150.0–400.0)
RBC: 4.62 Mil/uL (ref 4.22–5.81)
RDW: 14.4 % (ref 11.5–15.5)
WBC: 8.4 10*3/uL (ref 4.0–10.5)

## 2023-08-31 LAB — LIPID PANEL
Cholesterol: 173 mg/dL (ref 0–200)
HDL: 33.2 mg/dL — ABNORMAL LOW (ref >39.00)
LDL Cholesterol: 109 mg/dL — ABNORMAL HIGH (ref 0–99)
NonHDL: 139.99
Total CHOL/HDL Ratio: 5
Triglycerides: 154 mg/dL — ABNORMAL HIGH (ref 0.0–149.0)
VLDL: 30.8 mg/dL (ref 0.0–40.0)

## 2023-08-31 LAB — BASIC METABOLIC PANEL
BUN: 13 mg/dL (ref 6–23)
CO2: 28 meq/L (ref 19–32)
Calcium: 9.3 mg/dL (ref 8.4–10.5)
Chloride: 99 meq/L (ref 96–112)
Creatinine, Ser: 1.09 mg/dL (ref 0.40–1.50)
GFR: 69.2 mL/min (ref >60.00)
Glucose, Bld: 131 mg/dL — ABNORMAL HIGH (ref 70–99)
Potassium: 4.2 meq/L (ref 3.5–5.1)
Sodium: 136 meq/L (ref 135–145)

## 2023-08-31 LAB — HEPATIC FUNCTION PANEL
ALT: 27 U/L (ref 0–53)
AST: 23 U/L (ref 0–37)
Albumin: 4.1 g/dL (ref 3.5–5.2)
Alkaline Phosphatase: 37 U/L — ABNORMAL LOW (ref 39–117)
Bilirubin, Direct: 0.2 mg/dL (ref 0.0–0.3)
Total Bilirubin: 1 mg/dL (ref 0.2–1.2)
Total Protein: 7.1 g/dL (ref 6.0–8.3)

## 2023-08-31 LAB — TSH: TSH: 1.47 u[IU]/mL (ref 0.35–5.50)

## 2023-08-31 NOTE — Patient Instructions (Addendum)
Schedule your complete physical in 6 months We'll notify you of your lab results and make any changes if needed We'll call you with your pulmonary appt to evaluate the cough Continue to work on healthy diet and regular physical activity Use the Albuterol inhaler as needed for cough, wheezing, or shortness of breath Call with any questions or concerns Stay Safe!  Stay Healthy! Happy Fall!!!

## 2023-08-31 NOTE — Assessment & Plan Note (Signed)
Chronic problem.  Currently on Lisinopril hydrochlorothiazide daily w/o difficulty.  Adequate control.  Asymptomatic.  Check labs due to ACE and diuretic use but no anticipated med changes.  Will follow.

## 2023-08-31 NOTE — Progress Notes (Signed)
   Subjective:    Patient ID: Jason Bartlett, male    DOB: [birth year], 67 y.o.   MRN: [id number]  HPI HTN- chronic problem.  On Lisinopril hydrochlorothiazide 20/12.5mg  daily.  No CP, SOB, HA's, visual changes, edema.  Hyperlipidemia- pt has been attempting to control w/ diet and exercise.  No abd pain, N/V.  Obesity- BMI is 37.91  + 4 lb weight loss.  Pt is doing 6,000 steps/day.  Reports he is limited in strenuous activity due to cough.    Chronic cough- pt reports sxs worsened to [place]  Was given prednisone and albuterol.  Cough improved on prednisone but returned a few days later.  Some relief w/ albuterol.  No relief w/ delsym or mucinex.  'cough drops seem to work best'.  Cough is not productive, 'it's a tickle in my cough that won't go away'.  Currently on reflux medication.  No relief w/ increasing dose or taking at night.     Review of Systems For ROS see HPI     Objective:   Physical Exam Vitals reviewed.  Constitutional:      General: He is not in acute distress.    Appearance: Normal appearance. He is well-developed. He is obese. He is not ill-appearing.  HENT:     Head: Normocephalic and atraumatic.  Eyes:     Extraocular Movements: Extraocular movements intact.     Conjunctiva/sclera: Conjunctivae normal.     Pupils: Pupils are equal, round, and reactive to light.  Neck:     Thyroid: No thyromegaly.  Cardiovascular:     Rate and Rhythm: Normal rate and regular rhythm.     Pulses: Normal pulses.     Heart sounds: Normal heart sounds. No murmur heard. Pulmonary:     Effort: Pulmonary effort is normal. No respiratory distress.     Breath sounds: Wheezing (end expiratory 'squeak') present.  Abdominal:     General: Bowel sounds are normal. There is no distension.     Palpations: Abdomen is soft.  Musculoskeletal:     Cervical back: Normal range of motion and neck supple.     Right lower leg: No edema.     Left lower leg: No  edema.  Lymphadenopathy:     Cervical: No cervical adenopathy.  Skin:    General: Skin is warm and dry.  Neurological:     General: No focal deficit present.     Mental Status: He is alert and oriented to person, place, and time.     Cranial Nerves: No cranial nerve deficit.  Psychiatric:        Mood and Affect: Mood normal.        Behavior: Behavior normal.           Assessment & Plan:

## 2023-08-31 NOTE — Assessment & Plan Note (Signed)
Ongoing issue.  Pt has been dx'd w/ GERD in the past but is currently on tx and denies recurrent sxs.  Pt reports cough improved somewhat w/ albuterol and prednisone, but returns shortly after completion of medication.  Described as a dry cough due to a 'constant tickle'.  On exam today has diffuse end expiratory squeaks.  Will refer to Pulmonary for a complete evaluation.  Pt expressed understanding and is in agreement w/ plan.

## 2023-08-31 NOTE — Assessment & Plan Note (Signed)
Ongoing issue for pt.  Has lost 4 lbs since last visit.  Is now up to 6000 steps/day and trying to increase.  Somewhat limited due to his cough.  Encouraged low carb diet.  Check labs to risk stratify.  Will follow.

## 2023-08-31 NOTE — Assessment & Plan Note (Signed)
Chronic problem.  Has been diet controlled.  Check labs and determine if medication is needed

## 2023-09-01 ENCOUNTER — Telehealth: Payer: Self-pay

## 2023-09-01 ENCOUNTER — Ambulatory Visit (INDEPENDENT_AMBULATORY_CARE_PROVIDER_SITE_OTHER): Payer: Medicare Other

## 2023-09-01 ENCOUNTER — Other Ambulatory Visit: Payer: Self-pay

## 2023-09-01 DIAGNOSIS — R7309 Other abnormal glucose: Secondary | ICD-10-CM

## 2023-09-01 LAB — HEMOGLOBIN A1C: Hgb A1c MFr Bld: 6.6 % — ABNORMAL HIGH (ref 4.6–6.5)

## 2023-09-01 NOTE — Telephone Encounter (Signed)
Pt is aware of lab results and the Add on A1C has been faxed to lab

## 2023-09-01 NOTE — Telephone Encounter (Signed)
-----   Message from Neena Rhymes sent at 09/01/2023 12:47 PM EDT ----- Unfortunately your A1C indicates that you now have diabetes.  Thankfully this is just over the threshold and does not require medication at this time.  But it does mean that it is VERY important to eat a low carb/low sugar diet and get regular exercise.  I need you to come back for a follow up appt in 3-4 months to recheck A1C, sugars, and cholesterol.  Please schedule at your convenience.

## 2023-09-01 NOTE — Telephone Encounter (Signed)
-----   Message from Neena Rhymes sent at 09/01/2023  7:26 AM EDT ----- Labs look good w/ exception of elevated sugar.  Based on this, we will add an A1C to assess for possible diabetes

## 2023-09-01 NOTE — Telephone Encounter (Signed)
Pt seen results Via my chart  

## 2023-09-08 ENCOUNTER — Encounter (INDEPENDENT_AMBULATORY_CARE_PROVIDER_SITE_OTHER): Payer: Medicare Other | Admitting: Ophthalmology

## 2023-09-08 DIAGNOSIS — H353221 Exudative age-related macular degeneration, left eye, with active choroidal neovascularization: Secondary | ICD-10-CM | POA: Diagnosis not present

## 2023-09-08 DIAGNOSIS — H353112 Nonexudative age-related macular degeneration, right eye, intermediate dry stage: Secondary | ICD-10-CM | POA: Diagnosis not present

## 2023-09-08 DIAGNOSIS — I10 Essential (primary) hypertension: Secondary | ICD-10-CM | POA: Diagnosis not present

## 2023-09-08 DIAGNOSIS — H43813 Vitreous degeneration, bilateral: Secondary | ICD-10-CM

## 2023-09-08 DIAGNOSIS — H35033 Hypertensive retinopathy, bilateral: Secondary | ICD-10-CM

## 2023-09-12 ENCOUNTER — Ambulatory Visit: Payer: Medicare Other

## 2023-09-19 ENCOUNTER — Other Ambulatory Visit: Payer: Self-pay | Admitting: Family Medicine

## 2023-09-19 DIAGNOSIS — R052 Subacute cough: Secondary | ICD-10-CM

## 2023-09-21 ENCOUNTER — Other Ambulatory Visit: Payer: Self-pay | Admitting: Family Medicine

## 2023-09-21 DIAGNOSIS — R052 Subacute cough: Secondary | ICD-10-CM

## 2023-10-04 DIAGNOSIS — D120 Benign neoplasm of cecum: Secondary | ICD-10-CM | POA: Diagnosis not present

## 2023-10-04 DIAGNOSIS — K635 Polyp of colon: Secondary | ICD-10-CM | POA: Diagnosis not present

## 2023-10-04 DIAGNOSIS — D123 Benign neoplasm of transverse colon: Secondary | ICD-10-CM | POA: Diagnosis not present

## 2023-10-04 DIAGNOSIS — Z860101 Personal history of adenomatous and serrated colon polyps: Secondary | ICD-10-CM | POA: Diagnosis not present

## 2023-10-04 DIAGNOSIS — D124 Benign neoplasm of descending colon: Secondary | ICD-10-CM | POA: Diagnosis not present

## 2023-10-06 ENCOUNTER — Encounter (INDEPENDENT_AMBULATORY_CARE_PROVIDER_SITE_OTHER): Payer: Medicare Other | Admitting: Ophthalmology

## 2023-10-06 DIAGNOSIS — H35033 Hypertensive retinopathy, bilateral: Secondary | ICD-10-CM

## 2023-10-06 DIAGNOSIS — H353221 Exudative age-related macular degeneration, left eye, with active choroidal neovascularization: Secondary | ICD-10-CM

## 2023-10-06 DIAGNOSIS — H43813 Vitreous degeneration, bilateral: Secondary | ICD-10-CM

## 2023-10-06 DIAGNOSIS — I10 Essential (primary) hypertension: Secondary | ICD-10-CM | POA: Diagnosis not present

## 2023-10-06 DIAGNOSIS — D120 Benign neoplasm of cecum: Secondary | ICD-10-CM | POA: Diagnosis not present

## 2023-10-06 DIAGNOSIS — H353112 Nonexudative age-related macular degeneration, right eye, intermediate dry stage: Secondary | ICD-10-CM

## 2023-10-06 DIAGNOSIS — K635 Polyp of colon: Secondary | ICD-10-CM | POA: Diagnosis not present

## 2023-10-06 DIAGNOSIS — D124 Benign neoplasm of descending colon: Secondary | ICD-10-CM | POA: Diagnosis not present

## 2023-10-06 DIAGNOSIS — D123 Benign neoplasm of transverse colon: Secondary | ICD-10-CM | POA: Diagnosis not present

## 2023-10-24 ENCOUNTER — Encounter: Payer: Self-pay | Admitting: Pulmonary Disease

## 2023-10-24 ENCOUNTER — Ambulatory Visit: Payer: Medicare Other | Admitting: Pulmonary Disease

## 2023-10-24 VITALS — BP 106/70 | HR 111 | Temp 98.4°F | Ht 70.5 in | Wt 270.0 lb

## 2023-10-24 DIAGNOSIS — R053 Chronic cough: Secondary | ICD-10-CM

## 2023-10-24 NOTE — Patient Instructions (Signed)
I will see you as needed  Continue your inhaler at present but at some point, if you feel you have completely recovered to baseline, you can try and stop the inhaler for a few days to a few weeks  If the cough recurs, you can get back on the inhaler and use it for few more weeks  Call us with significant concerns  I do not believe any testing is needed at present  Will be glad to see you as needed

## 2023-10-24 NOTE — Progress Notes (Signed)
Jason Bartlett    [id number]    Jul 02, 1956  Primary Care Physician:Tabori, Helane Rima, MD  Referring Physician: Sheliah Hatch, MD [address],  [place] [address]  Chief complaint:   Chronic cough post-COVID  HPI:  Patient with chronic cough possible COVID Significant improvement in symptoms since being on Flovent  Did after visit urgent care for his cough, nebulization treatments did help  Symptoms significantly improved since being on Flovent Had a lot of coughing especially in the evening 20 stridently down to sleep No underlying history of lung disease   Never smoker  Did have a history of allergies growing up  The concern was that he may have had long COVID, was treated with Paxlovid, not vaccinated  Outpatient Encounter Medications as of 10/24/2023  Medication Sig   acetaminophen (TYLENOL) 500 MG tablet Take 1,000 mg by mouth every 6 (six) hours as needed for moderate pain.   albuterol (VENTOLIN HFA) 108 (90 Base) MCG/ACT inhaler Inhale 2 puffs into the lungs every 6 (six) hours as needed for wheezing or shortness of breath.   ciprofloxacin (CILOXAN) 0.3 % ophthalmic solution SMARTSIG:In Eye(s)   fluticasone (FLOVENT HFA) 110 MCG/ACT inhaler INHALE 1 PUFF INTO THE LUNGS IN THE MORNING AND AT BEDTIME   lisinopril-hydrochlorothiazide (ZESTORETIC) 20-12.5 MG tablet TAKE 1 TABLET BY MOUTH DAILY   omeprazole (PRILOSEC) 40 MG capsule TAKE 1 CAPSULE(40 MG) BY MOUTH DAILY   tadalafil (CIALIS) 10 MG tablet Take 1 tablet (10 mg total) by mouth daily.   No facility-administered encounter medications on file as of 10/24/2023.    Allergies as of 10/24/2023 - Review Complete 10/24/2023  Allergen Reaction Noted   Penicillins Swelling 09/18/2014   Sulfa antibiotics Swelling 09/18/2014    Past Medical History:  Diagnosis Date   GERD (gastroesophageal reflux disease)    History of chicken pox    Hyperlipidemia    Hypertension    Prostate  cancer (HCC)     Past Surgical History:  Procedure Laterality Date   EXTERNAL EAR SURGERY     PROSTATE SURGERY      Family History  Problem Relation Age of Onset   Cancer Mother        breast   Dementia Father    Hearing loss Father    Cancer Father        bone cancer   Hearing loss Brother    Early death Paternal Grandfather    Heart attack Paternal Grandfather     Social History   Socioeconomic History   Marital status: Married    Spouse name: Not on file   Number of children: Not on file   Years of education: Not on file   Highest education level: Not on file  Occupational History    Employer: [employer]  Tobacco Use   Smoking status: Never   Smokeless tobacco: Never  Vaping Use   Vaping status: Never Used  Substance and Sexual Activity   Alcohol use: Not Currently    Comment: social beer   Drug use: No   Sexual activity: Yes  Other Topics Concern   Not on file  Social History Narrative   Not on file   Social Determinants of Health   Financial Resource Strain: Low Risk  (08/30/2023)   Overall Financial Resource Strain (CARDIA)    Difficulty of Paying Living Expenses: Not hard at all  Food Insecurity: No Food Insecurity (08/30/2023)  Hunger Vital Sign    Worried About Running Out of Food in the Last Year: Never true    Ran Out of Food in the Last Year: Never true  Transportation Needs: No Transportation Needs (08/30/2023)   PRAPARE - Administrator, Civil Service (Medical): No    Lack of Transportation (Non-Medical): No  Physical Activity: Insufficiently Active (08/30/2023)   Exercise Vital Sign    Days of Exercise per Week: 2 days    Minutes of Exercise per Session: 30 min  Stress: No Stress Concern Present (08/30/2023)   [employer] of Occupational Health - Occupational Stress Questionnaire    Feeling of Stress : Not at all  Social Connections: Moderately Integrated (08/30/2023)   Social Connection and Isolation Panel [NHANES]     Frequency of Communication with Friends and Family: More than three times a week    Frequency of Social Gatherings with Friends and Family: Three times a week    Attends Religious Services: Never    Active Member of Clubs or Organizations: Yes    Attends Banker Meetings: More than 4 times per year    Marital Status: Living with partner  Intimate Partner Violence: Not At Risk (08/30/2023)   Humiliation, Afraid, Rape, and Kick questionnaire    Fear of Current or Ex-Partner: No    Emotionally Abused: No    Physically Abused: No    Sexually Abused: No    Review of Systems  Respiratory:  Positive for cough. Negative for shortness of breath.   All other systems reviewed and are negative.   Vitals:   10/24/23 1337  BP: 106/70  Pulse: (!) 111  Temp: 98.4 F (36.9 C)  SpO2: 94%     Physical Exam Constitutional:      Appearance: He is obese.  HENT:     Head: Normocephalic.     Mouth/Throat:     Mouth: Mucous membranes are moist.  Eyes:     General: No scleral icterus. Cardiovascular:     Rate and Rhythm: Normal rate and regular rhythm.     Heart sounds: No murmur heard.    No friction rub.  Pulmonary:     Effort: No respiratory distress.     Breath sounds: No stridor. No rhonchi.  Musculoskeletal:     Cervical back: No rigidity or tenderness.  Neurological:     Mental Status: He is alert.  Psychiatric:        Mood and Affect: Mood normal.    Data Reviewed: Chest x-ray was 08/12/2022-no acute infiltrate CT PE protocol in 2019-no significant abnormality  Assessment:  Chronic cough  Post COVID cough    Plan/Recommendations: With significant improvement in symptoms -Will not recommend any further testing at present, has no underlying lung disease  May wean off inhaler at some point  Will see as needed  May consider pulmonary function test if nonresolution of symptoms  Call us with significant concerns     Virl Diamond MD [facility] 10/24/2023, 1:52 PM  CC: Sheliah Hatch, MD

## 2023-11-03 ENCOUNTER — Encounter (INDEPENDENT_AMBULATORY_CARE_PROVIDER_SITE_OTHER): Payer: Medicare Other | Admitting: Ophthalmology

## 2023-11-03 DIAGNOSIS — I10 Essential (primary) hypertension: Secondary | ICD-10-CM

## 2023-11-03 DIAGNOSIS — H43813 Vitreous degeneration, bilateral: Secondary | ICD-10-CM

## 2023-11-03 DIAGNOSIS — H35033 Hypertensive retinopathy, bilateral: Secondary | ICD-10-CM | POA: Diagnosis not present

## 2023-11-03 DIAGNOSIS — H353221 Exudative age-related macular degeneration, left eye, with active choroidal neovascularization: Secondary | ICD-10-CM

## 2023-11-03 DIAGNOSIS — H353112 Nonexudative age-related macular degeneration, right eye, intermediate dry stage: Secondary | ICD-10-CM

## 2023-12-05 ENCOUNTER — Encounter (INDEPENDENT_AMBULATORY_CARE_PROVIDER_SITE_OTHER): Payer: Medicare Other | Admitting: Ophthalmology

## 2023-12-05 DIAGNOSIS — H353221 Exudative age-related macular degeneration, left eye, with active choroidal neovascularization: Secondary | ICD-10-CM | POA: Diagnosis not present

## 2023-12-05 DIAGNOSIS — H43813 Vitreous degeneration, bilateral: Secondary | ICD-10-CM | POA: Diagnosis not present

## 2023-12-05 DIAGNOSIS — I10 Essential (primary) hypertension: Secondary | ICD-10-CM

## 2023-12-05 DIAGNOSIS — H2513 Age-related nuclear cataract, bilateral: Secondary | ICD-10-CM

## 2023-12-05 DIAGNOSIS — H35033 Hypertensive retinopathy, bilateral: Secondary | ICD-10-CM | POA: Diagnosis not present

## 2023-12-12 ENCOUNTER — Other Ambulatory Visit: Payer: Medicare Other

## 2023-12-14 ENCOUNTER — Ambulatory Visit (INDEPENDENT_AMBULATORY_CARE_PROVIDER_SITE_OTHER): Payer: Medicare Other | Admitting: Family Medicine

## 2023-12-14 ENCOUNTER — Encounter: Payer: Self-pay | Admitting: Family Medicine

## 2023-12-14 VITALS — BP 108/62 | HR 84 | Temp 98.4°F | Ht 70.5 in | Wt 263.4 lb

## 2023-12-14 DIAGNOSIS — E119 Type 2 diabetes mellitus without complications: Secondary | ICD-10-CM | POA: Diagnosis not present

## 2023-12-14 DIAGNOSIS — Z1159 Encounter for screening for other viral diseases: Secondary | ICD-10-CM

## 2023-12-14 DIAGNOSIS — R5383 Other fatigue: Secondary | ICD-10-CM

## 2023-12-14 LAB — BASIC METABOLIC PANEL
BUN: 18 mg/dL (ref 6–23)
CO2: 28 meq/L (ref 19–32)
Calcium: 9.3 mg/dL (ref 8.4–10.5)
Chloride: 97 meq/L (ref 96–112)
Creatinine, Ser: 1.18 mg/dL (ref 0.40–1.50)
GFR: 62.79 mL/min (ref >60.00)
Glucose, Bld: 110 mg/dL — ABNORMAL HIGH (ref 70–99)
Potassium: 4.1 meq/L (ref 3.5–5.1)
Sodium: 135 meq/L (ref 135–145)

## 2023-12-14 LAB — HEMOGLOBIN A1C: Hgb A1c MFr Bld: 6.6 % — ABNORMAL HIGH (ref 4.6–6.5)

## 2023-12-14 LAB — CBC WITH DIFFERENTIAL/PLATELET
Basophils Absolute: 0.1 10*3/uL (ref 0.0–0.1)
Basophils Relative: 1.3 % (ref 0.0–3.0)
Eosinophils Absolute: 0.3 10*3/uL (ref 0.0–0.7)
Eosinophils Relative: 4.6 % (ref 0.0–5.0)
HCT: 39.9 % (ref 39.0–52.0)
Hemoglobin: 13.4 g/dL (ref 13.0–17.0)
Lymphocytes Relative: 15.7 % (ref 12.0–46.0)
Lymphs Abs: 1.1 10*3/uL (ref 0.7–4.0)
MCHC: 33.5 g/dL (ref 30.0–36.0)
MCV: 91.8 fL (ref 78.0–100.0)
Monocytes Absolute: 0.7 10*3/uL (ref 0.1–1.0)
Monocytes Relative: 9.4 % (ref 3.0–12.0)
Neutro Abs: 4.9 10*3/uL (ref 1.4–7.7)
Neutrophils Relative %: 69 % (ref 43.0–77.0)
Platelets: 190 10*3/uL (ref 150.0–400.0)
RBC: 4.35 Mil/uL (ref 4.22–5.81)
RDW: 14.3 % (ref 11.5–15.5)
WBC: 7.2 10*3/uL (ref 4.0–10.5)

## 2023-12-14 LAB — B12 AND FOLATE PANEL
Folate: >24.2 ng/mL (ref >5.9)
Vitamin B-12: 479 pg/mL (ref 211–911)

## 2023-12-14 LAB — MICROALBUMIN / CREATININE URINE RATIO
Creatinine,U: 142.7 mg/dL
Microalb Creat Ratio: 5.2 mg/g (ref 0.0–30.0)
Microalb, Ur: 7.5 mg/dL — ABNORMAL HIGH (ref 0.0–1.9)

## 2023-12-14 LAB — VITAMIN D 25 HYDROXY (VIT D DEFICIENCY, FRACTURES): VITD: 40.22 ng/mL (ref 30.00–100.00)

## 2023-12-14 LAB — TSH: TSH: 1.49 u[IU]/mL (ref 0.35–5.50)

## 2023-12-14 LAB — TESTOSTERONE: Testosterone: 319.3 ng/dL (ref 300.00–890.00)

## 2023-12-14 NOTE — Patient Instructions (Addendum)
Schedule your complete physical in 3 months We'll notify you of your lab results and make any changes if needed Continue to work on healthy diet and regular exercise- you can do it! We'll see if there's a reason for the fatigue Try and follow a schedule to avoid settling in and feeling lazy Call with any questions or concerns Stay Safe!  Stay Healthy! Happy Holidays!

## 2023-12-14 NOTE — Assessment & Plan Note (Signed)
New dx at last visit.  He is UTD on eye exam.  Foot exam done today.  Microalbumin ordered.  A1C was 6.6%  He reports decreasing carb intake considerably.  He is not getting any regular exercise.  Check labs.  Start meds prn.

## 2023-12-14 NOTE — Progress Notes (Signed)
   Subjective:    Patient ID: Jason Bartlett, male    DOB: [birth year], 67 y.o.   MRN: [id number]  HPI Diabetes- new dx at last visit.  Had eye exam on 07/10/23 w/ Dr Delaney Meigs.  Pt is down 5 lbs.  No CP, SOB, HA's, visual changes, abd pain, N/V.  Has been limiting carbs.  Pt has chronic tingling of feet but good sensation.  Low energy- 'i just don't want to get up and get started'.  Reports doing fine after he is up and moving.  Has some morning stiffness.  No regular exercise.  Pt admits he is a bit lazy since retiring and not having to get up regularly.   Review of Systems For ROS see HPI     Objective:   Physical Exam Vitals reviewed.  Constitutional:      General: He is not in acute distress.    Appearance: Normal appearance. He is well-developed. He is obese. He is not ill-appearing.  HENT:     Head: Normocephalic and atraumatic.  Eyes:     Extraocular Movements: Extraocular movements intact.     Conjunctiva/sclera: Conjunctivae normal.     Pupils: Pupils are equal, round, and reactive to light.  Neck:     Thyroid: No thyromegaly.  Cardiovascular:     Rate and Rhythm: Normal rate and regular rhythm.     Pulses: Normal pulses.     Heart sounds: Normal heart sounds. No murmur heard. Pulmonary:     Effort: Pulmonary effort is normal. No respiratory distress.     Breath sounds: Normal breath sounds.  Abdominal:     General: Bowel sounds are normal. There is no distension.     Palpations: Abdomen is soft.  Musculoskeletal:     Cervical back: Normal range of motion and neck supple.     Right lower leg: No edema.     Left lower leg: No edema.  Lymphadenopathy:     Cervical: No cervical adenopathy.  Skin:    General: Skin is warm and dry.  Neurological:     General: No focal deficit present.     Mental Status: He is alert and oriented to person, place, and time.     Cranial Nerves: No cranial nerve deficit.  Psychiatric:        Behavior: Behavior normal.      Comments: Slightly flat affect           Assessment & Plan:  Fatigue- new.  Pt reports he is having a hard time 'getting up and going'.  Discussed that there could be multiple causes for this- metabolic cause, lack of physical exercise, mood, or combination of factors.  Will check labs to assess for underlying cause.  Encouraged regular physical activity and following some type of daily schedule now that he's retired so he doesn't just sit.  Will treat any metabolic abnormalities if present.  Pt expressed understanding and is in agreement w/ plan.

## 2023-12-15 ENCOUNTER — Telehealth: Payer: Self-pay

## 2023-12-15 LAB — HEPATITIS C ANTIBODY: Hepatitis C Ab: NONREACTIVE

## 2023-12-15 NOTE — Telephone Encounter (Signed)
-----   Message from Neena Rhymes sent at 12/15/2023  3:42 PM EST ----- Labs look good!  No obvious cause for fatigue- this is good news!

## 2023-12-15 NOTE — Telephone Encounter (Signed)
Testosterone is within normal range.  It is at the low end of normal but this is to be expected at 67 yrs old.  At this time, there is nothing to do given that his level is normal and that means it is unlikely to be the cause of his fatigue.  Typically the level is much lower if causing symptoms.

## 2023-12-15 NOTE — Telephone Encounter (Signed)
Patient is concerned his testosterone being low is contributing to fatigue and would like your thoughts

## 2023-12-18 ENCOUNTER — Telehealth: Payer: Self-pay

## 2023-12-18 NOTE — Telephone Encounter (Signed)
Spoke to patient's wife and she wanted me to ask Dr. Beverely Low if there was anything she could suggest for Paxson's low energy. She states he is always fatigued and has no energy and she questioned his most recent lab work wondering if it had to do with his testosterone being on the low side. I told her according to Dr. Beverely Low everything was normal but I would certainly ask to see what she suggests and someone would give her a call back.

## 2023-12-18 NOTE — Telephone Encounter (Signed)
Pt was advise to half bothhydrochlorothiazide and Lisinopril. Pt has appt in 4 wks to F/U

## 2023-12-18 NOTE — Telephone Encounter (Signed)
His blood pressure is on the low side- which can cause fatigue.  I would have him break his Lisinopril hydrochlorothiazide in half and follow up in 3-4 weeks to recheck BP and energy level.  Decreasing this medication should help his energy level

## 2023-12-18 NOTE — Telephone Encounter (Signed)
Please see telephone note

## 2024-01-02 ENCOUNTER — Encounter (INDEPENDENT_AMBULATORY_CARE_PROVIDER_SITE_OTHER): Payer: Medicare Other | Admitting: Ophthalmology

## 2024-01-02 DIAGNOSIS — H353221 Exudative age-related macular degeneration, left eye, with active choroidal neovascularization: Secondary | ICD-10-CM | POA: Diagnosis not present

## 2024-01-02 DIAGNOSIS — I10 Essential (primary) hypertension: Secondary | ICD-10-CM

## 2024-01-02 DIAGNOSIS — H353112 Nonexudative age-related macular degeneration, right eye, intermediate dry stage: Secondary | ICD-10-CM

## 2024-01-02 DIAGNOSIS — H35033 Hypertensive retinopathy, bilateral: Secondary | ICD-10-CM | POA: Diagnosis not present

## 2024-01-02 DIAGNOSIS — H43813 Vitreous degeneration, bilateral: Secondary | ICD-10-CM | POA: Diagnosis not present

## 2024-01-15 ENCOUNTER — Ambulatory Visit: Payer: Medicare Other | Admitting: Family Medicine

## 2024-01-22 ENCOUNTER — Encounter: Payer: Self-pay | Admitting: Family Medicine

## 2024-01-22 ENCOUNTER — Ambulatory Visit (INDEPENDENT_AMBULATORY_CARE_PROVIDER_SITE_OTHER): Payer: Medicare Other | Admitting: Family Medicine

## 2024-01-22 VITALS — BP 118/72 | HR 96 | Temp 98.2°F | Ht 70.5 in | Wt 269.0 lb

## 2024-01-22 DIAGNOSIS — R052 Subacute cough: Secondary | ICD-10-CM | POA: Diagnosis not present

## 2024-01-22 DIAGNOSIS — I10 Essential (primary) hypertension: Secondary | ICD-10-CM | POA: Diagnosis not present

## 2024-01-22 MED ORDER — ALBUTEROL SULFATE HFA 108 (90 BASE) MCG/ACT IN AERS: 2.0000 | INHALATION_SPRAY | Freq: Four times a day (QID) | RESPIRATORY_TRACT | 3 refills | Status: DC | PRN

## 2024-01-22 NOTE — Assessment & Plan Note (Signed)
Chronic problem.  BP is well controlled today after decreasing medication to 1/2 tab daily.  Currently asymptomatic.  No changes at this time

## 2024-01-22 NOTE — Patient Instructions (Signed)
Follow up as needed or as scheduled CONTINUE the Zyrtec daily ADD the Flonase- 2 sprays each nostril- daily USE the Albuterol inhaler as needed for cough/shortness of breath Call with any questions or concerns Stay Safe!  Stay Healthy! HAPPY BIRTHDAY!!!

## 2024-01-22 NOTE — Progress Notes (Signed)
   Subjective:    Patient ID: Jason Bartlett, male    DOB: Jun 22, 1956, 68 y.o.   MRN: [id number]  HPI Cough- pt reports non productive cough started 'a few weeks ago'.  Described as a 'tickle that annoys me'.  Cough lasts all day and makes it hard to sleep at night.  Denies PND or excessive nasal congestion.  In the past has required albuterol for RAD.  On Zyrtec daily.  Has Flonase at home but not currently using.  HTN- chronic problem, currently taking 1/2 Lisinopril hydrochlorothiazide 20/12.5mg  daily w/ good control.  No CP, HA's, SOB.   Review of Systems For ROS see HPI     Objective:   Physical Exam Vitals reviewed.  Constitutional:      General: He is not in acute distress.    Appearance: Normal appearance. He is well-developed. He is not ill-appearing.  HENT:     Head: Normocephalic and atraumatic.     Nose: Congestion (mild) present.     Mouth/Throat:     Comments: Mild PND Eyes:     Extraocular Movements: Extraocular movements intact.     Conjunctiva/sclera: Conjunctivae normal.     Pupils: Pupils are equal, round, and reactive to light.  Neck:     Thyroid: No thyromegaly.  Cardiovascular:     Rate and Rhythm: Normal rate and regular rhythm.     Pulses: Normal pulses.     Heart sounds: Normal heart sounds. No murmur heard. Pulmonary:     Effort: Pulmonary effort is normal. No respiratory distress.     Breath sounds: Normal breath sounds.  Abdominal:     General: Bowel sounds are normal. There is no distension.     Palpations: Abdomen is soft.  Musculoskeletal:     Cervical back: Normal range of motion and neck supple.     Right lower leg: No edema.     Left lower leg: No edema.  Lymphadenopathy:     Cervical: No cervical adenopathy.  Skin:    General: Skin is warm and dry.  Neurological:     General: No focal deficit present.     Mental Status: He is alert and oriented to person, place, and time.     Cranial Nerves: No cranial nerve deficit.   Psychiatric:        Mood and Affect: Mood normal.        Behavior: Behavior normal.           Assessment & Plan:  Subacute cough- new.  Lungs are CTAB.  Pt reports it starts as a tickle in the back of his throat and then causes dry, nonproductive cough.  Mild PND on PE.  Start Flonase in addition to daily Zyrtec.  Use Albuterol prn.  Pt expressed understanding and is in agreement w/ plan.

## 2024-01-28 ENCOUNTER — Other Ambulatory Visit: Payer: Self-pay | Admitting: Family Medicine

## 2024-01-28 DIAGNOSIS — I10 Essential (primary) hypertension: Secondary | ICD-10-CM

## 2024-01-30 ENCOUNTER — Encounter (INDEPENDENT_AMBULATORY_CARE_PROVIDER_SITE_OTHER): Payer: Medicare Other | Admitting: Ophthalmology

## 2024-01-30 DIAGNOSIS — H43813 Vitreous degeneration, bilateral: Secondary | ICD-10-CM | POA: Diagnosis not present

## 2024-01-30 DIAGNOSIS — H353221 Exudative age-related macular degeneration, left eye, with active choroidal neovascularization: Secondary | ICD-10-CM

## 2024-01-30 DIAGNOSIS — H353112 Nonexudative age-related macular degeneration, right eye, intermediate dry stage: Secondary | ICD-10-CM

## 2024-01-30 DIAGNOSIS — I10 Essential (primary) hypertension: Secondary | ICD-10-CM

## 2024-01-30 DIAGNOSIS — H35033 Hypertensive retinopathy, bilateral: Secondary | ICD-10-CM | POA: Diagnosis not present

## 2024-02-27 ENCOUNTER — Encounter (INDEPENDENT_AMBULATORY_CARE_PROVIDER_SITE_OTHER): Payer: Medicare Other | Admitting: Ophthalmology

## 2024-02-27 DIAGNOSIS — H353221 Exudative age-related macular degeneration, left eye, with active choroidal neovascularization: Secondary | ICD-10-CM | POA: Diagnosis not present

## 2024-02-27 DIAGNOSIS — H43813 Vitreous degeneration, bilateral: Secondary | ICD-10-CM | POA: Diagnosis not present

## 2024-02-27 DIAGNOSIS — H353112 Nonexudative age-related macular degeneration, right eye, intermediate dry stage: Secondary | ICD-10-CM | POA: Diagnosis not present

## 2024-02-29 ENCOUNTER — Other Ambulatory Visit: Payer: Self-pay

## 2024-02-29 ENCOUNTER — Encounter (HOSPITAL_BASED_OUTPATIENT_CLINIC_OR_DEPARTMENT_OTHER): Payer: Self-pay

## 2024-02-29 ENCOUNTER — Emergency Department (HOSPITAL_BASED_OUTPATIENT_CLINIC_OR_DEPARTMENT_OTHER)
Admission: EM | Admit: 2024-02-29 | Discharge: 2024-02-29 | Disposition: A | Discharge status: Home / Self Care | Attending: Emergency Medicine | Admitting: Emergency Medicine

## 2024-02-29 ENCOUNTER — Ambulatory Visit: Payer: Self-pay | Admitting: Family Medicine

## 2024-02-29 DIAGNOSIS — K625 Hemorrhage of anus and rectum: Secondary | ICD-10-CM | POA: Insufficient documentation

## 2024-02-29 LAB — COMPREHENSIVE METABOLIC PANEL
ALT: 23 U/L (ref 0–44)
AST: 27 U/L (ref 15–41)
Albumin: 3.9 g/dL (ref 3.5–5.0)
Alkaline Phosphatase: 34 U/L — ABNORMAL LOW (ref 38–126)
Anion gap: 10 (ref 5–15)
BUN: 12 mg/dL (ref 8–23)
CO2: 24 mmol/L (ref 22–32)
Calcium: 9.2 mg/dL (ref 8.9–10.3)
Chloride: 100 mmol/L (ref 98–111)
Creatinine, Ser: 1.04 mg/dL (ref 0.61–1.24)
GFR, Estimated: >60 mL/min (ref >60)
Glucose, Bld: 143 mg/dL — ABNORMAL HIGH (ref 70–99)
Potassium: 4 mmol/L (ref 3.5–5.1)
Sodium: 134 mmol/L — ABNORMAL LOW (ref 135–145)
Total Bilirubin: 0.9 mg/dL (ref 0.0–1.2)
Total Protein: 7.4 g/dL (ref 6.5–8.1)

## 2024-02-29 LAB — CBC WITH DIFFERENTIAL/PLATELET
Abs Immature Granulocytes: 0.03 10*3/uL (ref 0.00–0.07)
Basophils Absolute: 0.1 10*3/uL (ref 0.0–0.1)
Basophils Relative: 1 %
Eosinophils Absolute: 0.3 10*3/uL (ref 0.0–0.5)
Eosinophils Relative: 3 %
HCT: 40.7 % (ref 39.0–52.0)
Hemoglobin: 13.7 g/dL (ref 13.0–17.0)
Immature Granulocytes: 0 %
Lymphocytes Relative: 15 %
Lymphs Abs: 1.2 10*3/uL (ref 0.7–4.0)
MCH: 30.6 pg (ref 26.0–34.0)
MCHC: 33.7 g/dL (ref 30.0–36.0)
MCV: 90.8 fL (ref 80.0–100.0)
Monocytes Absolute: 0.5 10*3/uL (ref 0.1–1.0)
Monocytes Relative: 7 %
Neutro Abs: 6.1 10*3/uL (ref 1.7–7.7)
Neutrophils Relative %: 74 %
Platelets: 161 10*3/uL (ref 150–400)
RBC: 4.48 MIL/uL (ref 4.22–5.81)
RDW: 13.4 % (ref 11.5–15.5)
WBC: 8.3 10*3/uL (ref 4.0–10.5)
nRBC: 0 % (ref 0.0–0.2)

## 2024-02-29 LAB — OCCULT BLOOD X 1 CARD TO LAB, STOOL: Fecal Occult Bld: NEGATIVE

## 2024-02-29 NOTE — ED Provider Notes (Signed)
 [place] [facility] Provider Note   CSN: [id number] Arrival date & time: 02/29/24  1134     History  Chief Complaint  Patient presents with   Rectal Bleeding    Jason Bartlett is a 68 y.o. male.  HPI 68 year old male presents with rectal bleeding.  Patient states that last night his abdomen was uncomfortable and felt like he had a lot of gas in his lower abdomen.  He had a bowel movement that was normal and a lot of the symptoms went away.  Never fully went away and this morning still has some discomfort and around 9 AM went to go urinate but decided to sit on the toilet and as he passed gas he noticed that a little bit of blood and mucus came out.  No clots.  He estimates this to be 2 tablespoons.  Has not recurred ever since.  He is not on any blood thinners and his minimal abdominal discomfort went away after passing this blood.  He denies any rectal pain.  States he had some polyps removed last year on a colonoscopy.  No nausea or vomiting or fever.  No recent travel or antibiotic use.  Home Medications Prior to Admission medications   Medication Sig Start Date End Date Taking? Authorizing Provider  acetaminophen (TYLENOL) 500 MG tablet Take 1,000 mg by mouth every 6 (six) hours as needed for moderate pain.    [provider]  albuterol (VENTOLIN HFA) 108 (90 Base) MCG/ACT inhaler Inhale 2 puffs into the lungs every 6 (six) hours as needed for wheezing or shortness of breath. 01/22/24   Sheliah Hatch, MD  allopurinol (ZYLOPRIM) 100 MG tablet TAKE 1 TABLET(100 MG) BY MOUTH DAILY 01/30/24   Sheliah Hatch, MD  ciprofloxacin (CILOXAN) 0.3 % ophthalmic solution SMARTSIG:In Eye(s) 08/10/23   [provider]  fluticasone (FLOVENT HFA) 110 MCG/ACT inhaler INHALE 1 PUFF INTO THE LUNGS IN THE MORNING AND AT BEDTIME 09/21/23   Sheliah Hatch, MD  lisinopril-hydrochlorothiazide (ZESTORETIC) 20-12.5 MG tablet TAKE 1 TABLET BY  MOUTH DAILY 01/30/24   Sheliah Hatch, MD  omeprazole (PRILOSEC) 40 MG capsule TAKE 1 CAPSULE(40 MG) BY MOUTH DAILY 01/30/24   Sheliah Hatch, MD  tadalafil (CIALIS) 10 MG tablet Take 1 tablet (10 mg total) by mouth daily. 07/20/23   Sheliah Hatch, MD      Allergies    Penicillins and Sulfa antibiotics    Review of Systems   Review of Systems  Gastrointestinal:  Positive for blood in stool. Negative for nausea, rectal pain and vomiting.    Physical Exam Updated Vital Signs BP 126/89 (BP Location: Right Arm)   Pulse 95   Temp 98.3 F (36.8 C) (Oral)   Resp 20   Ht 5' 10.5" (1.791 m)   Wt 117.9 kg   SpO2 100%   BMI 36.78 kg/m  Physical Exam Vitals and nursing note reviewed. Exam conducted with a chaperone present.  Constitutional:      Appearance: He is well-developed.  HENT:     Head: Normocephalic and atraumatic.  Cardiovascular:     Rate and Rhythm: Normal rate and regular rhythm.  Pulmonary:     Effort: Pulmonary effort is normal.  Abdominal:     General: There is no distension.     Palpations: Abdomen is soft.     Tenderness: There is no abdominal tenderness.  Genitourinary:    Rectum: No mass, tenderness, anal fissure, external  hemorrhoid or internal hemorrhoid.     Comments: No blood or stool on DRE Skin:    General: Skin is warm and dry.  Neurological:     Mental Status: He is alert.     ED Results / Procedures / Treatments   Labs (all labs ordered are listed, but only abnormal results are displayed) Labs Reviewed  COMPREHENSIVE METABOLIC PANEL - Abnormal; Notable for the following components:      Result Value   Sodium 134 (*)    Glucose, Bld 143 (*)    Alkaline Phosphatase 34 (*)    All other components within normal limits  CBC WITH DIFFERENTIAL/PLATELET  OCCULT BLOOD X 1 CARD TO LAB, STOOL  POC OCCULT BLOOD, ED    EKG None  Radiology No results found.  Procedures Procedures    Medications Ordered in ED Medications - No  data to display  ED Course/ Medical Decision Making/ A&P                                 Medical Decision Making Amount and/or Complexity of Data Reviewed Labs: ordered.    Details: Hemoglobin 13.7, normal.   Patient has not had any further episodes.  This been over 4 hours since his last episode, his hemoglobin is normal, and his rectal exam is unremarkable.  He has no abdominal pain at this time.  Unclear why he had this episode with some mild amount of blood but given no recurrence and well appearance and no blood thinners I think it is reasonable to manage this as an outpatient with follow-up with his gastroenterologist.  We discussed return precautions and to return if it occurs again but otherwise, I do not think further imaging or admission is needed.  Will give return precautions.        Final Clinical Impression(s) / ED Diagnoses Final diagnoses:  Rectal bleeding    Rx / DC Orders ED Discharge Orders     None         Pricilla Loveless, MD 02/29/24 1428

## 2024-02-29 NOTE — Telephone Encounter (Signed)
  Chief Complaint: Rectal Bleeding Symptoms: crampy abdominal pain, blood in stool x2. Frequency: this morning Pertinent Negatives: Patient denies dizziness, CP, SOB Disposition: [x] ED /[] Urgent Care (no appt availability in office) / [] Appointment(In office/virtual)/ []  [facility]/ [] Home Care/ [] Refused Recommended Disposition /[] [facility]/ []  Follow-up with PCP Additional Notes: patient's wife calling with patient next to her with concerns of rectal bleeding along with crampy abdominal pain. Patient states he has had two episodes of rectal bleeding with passing stool. Patient denies dizziness, CP and SOB. Per protocol, recommendation is for the ED. Both patient and wife agree to go ahead and get checked out in the ED. Patient and wife verbalized understanding of plan and all questions answered.    Copied from CRM [id number]. Topic: Clinical - Red Word Triage >> Feb 29, 2024 10:17 AM Adele Barthel wrote: Red Word that prompted transfer to Nurse Triage:   Felt like he was going to need to stool this morning No stool but did drops and 2 to 3 small clots that were mucousy When he stood up, had several drops of blood on floor No history of hemorrhoids. Reason for Disposition  [1] MODERATE rectal bleeding (small blood clots, passing blood without stool, or toilet water turns red) AND [2] more than once a day  Answer Assessment - Initial Assessment Questions 1. APPEARANCE of BLOOD: "What color is it?" "Is it passed separately, on the surface of the stool, or mixed in with the stool?"      Blood was mucousy and very red 2. AMOUNT: "How much blood was passed?"      Drops of blood and the clots in the toilet were the size of a quarter 3. FREQUENCY: "How many times has blood been passed with the stools?"      2 4. ONSET: "When was the blood first seen in the stools?" (Days or weeks)      Early today 5. DIARRHEA: "Is there also some diarrhea?" If Yes, ask: "How many diarrhea  stools in the past 24 hours?"      No  6. CONSTIPATION: "Do you have constipation?" If Yes, ask: "How bad is it?"     States he felt constipated but doesn't anymore 7. RECURRENT SYMPTOMS: "Have you had blood in your stools before?" If Yes, ask: "When was the last time?" and "What happened that time?"      No 8. BLOOD THINNERS: "Do you take any blood thinners?" (e.g., Coumadin/warfarin, Pradaxa/dabigatran, aspirin)     No 9. OTHER SYMPTOMS: "Do you have any other symptoms?"  (e.g., abdomen pain, vomiting, dizziness, fever)     Abdominal pain-crampy in nature  Protocols used: Rectal Bleeding-A-AH

## 2024-02-29 NOTE — ED Notes (Signed)
 ED Provider at bedside.

## 2024-02-29 NOTE — Telephone Encounter (Signed)
 FYI patient is in the ED at this time [facility]

## 2024-02-29 NOTE — ED Triage Notes (Signed)
 Patient arrives POV with complaints of one episode of rectal bleeding (bright red) that occurred this morning. Patient states that he has not had this happen before. He does not take blood thinners or asprin.  No pain at this time.

## 2024-02-29 NOTE — Discharge Instructions (Signed)
 If you develop recurrent rectal bleeding, abdominal pain, or any other new/concerning symptoms then return to the ER or call 911.  Otherwise see your gastroenterologist.

## 2024-03-01 ENCOUNTER — Ambulatory Visit: Payer: Medicare Other | Admitting: Family Medicine

## 2024-03-01 ENCOUNTER — Encounter: Payer: Self-pay | Admitting: Family Medicine

## 2024-03-01 VITALS — BP 116/74 | HR 94 | Ht 70.0 in | Wt 262.0 lb

## 2024-03-01 DIAGNOSIS — M2550 Pain in unspecified joint: Secondary | ICD-10-CM | POA: Diagnosis not present

## 2024-03-01 DIAGNOSIS — K649 Unspecified hemorrhoids: Secondary | ICD-10-CM | POA: Diagnosis not present

## 2024-03-01 DIAGNOSIS — Z0000 Encounter for general adult medical examination without abnormal findings: Secondary | ICD-10-CM

## 2024-03-01 DIAGNOSIS — K5900 Constipation, unspecified: Secondary | ICD-10-CM | POA: Diagnosis not present

## 2024-03-01 DIAGNOSIS — E119 Type 2 diabetes mellitus without complications: Secondary | ICD-10-CM | POA: Diagnosis not present

## 2024-03-01 DIAGNOSIS — Z125 Encounter for screening for malignant neoplasm of prostate: Secondary | ICD-10-CM | POA: Diagnosis not present

## 2024-03-01 LAB — LIPID PANEL
Cholesterol: 168 mg/dL (ref 0–200)
HDL: 31.8 mg/dL — ABNORMAL LOW (ref >39.00)
LDL Cholesterol: 96 mg/dL (ref 0–99)
NonHDL: 136.06
Total CHOL/HDL Ratio: 5
Triglycerides: 200 mg/dL — ABNORMAL HIGH (ref 0.0–149.0)
VLDL: 40 mg/dL (ref 0.0–40.0)

## 2024-03-01 LAB — PSA, MEDICARE: PSA: 4.93 ng/mL — ABNORMAL HIGH (ref 0.10–4.00)

## 2024-03-01 LAB — HEMOGLOBIN A1C: Hgb A1c MFr Bld: 6.4 % (ref 4.6–6.5)

## 2024-03-01 LAB — TSH: TSH: 2.05 u[IU]/mL (ref 0.35–5.50)

## 2024-03-01 NOTE — Progress Notes (Signed)
   Subjective:    Patient ID: Jason Bartlett, male    DOB: 05-25-1956, 68 y.o.   MRN: [id number]  HPI CPE- UTD on eye exam, foot exam, microalbumin, Tdap, colonoscopy, PNA  Patient Care Team    Relationship Specialty Notifications Start End  Sheliah Hatch, MD PCP - General Family Medicine  06/29/21   Malen Gauze, MD Consulting Physician Urology  02/16/21   Lenn Sink, [place] Consulting Physician Podiatry  02/16/21   Sherrie George, MD Consulting Physician Ophthalmology  01/22/24     Health Maintenance  Topic Date Due   COVID-19 Vaccine (3 - Pfizer risk series) 04/22/2020   HEMOGLOBIN A1C  06/13/2024   OPHTHALMOLOGY EXAM  07/09/2024   Medicare Annual Wellness (AWV)  08/29/2024   Diabetic kidney evaluation - Urine ACR  12/13/2024   FOOT EXAM  12/13/2024   Diabetic kidney evaluation - eGFR measurement  02/28/2025   DTaP/Tdap/Td (2 - Td or Tdap) 02/09/2028   Colonoscopy  10/03/2033   Pneumonia Vaccine 35+ Years old  Completed   INFLUENZA VACCINE  Completed   Hepatitis C Screening  Completed   Zoster Vaccines- Shingrix  Completed   HPV VACCINES  Aged Out      Review of Systems Patient reports no vision/hearing changes, anorexia, fever ,adenopathy, persistant/recurrent hoarseness, swallowing issues, chest pain, palpitations, edema, hemoptysis, dyspnea (rest,exertional, paroxysmal nocturnal), abdominal pain, excessive heart burn, GU symptoms (dysuria, hematuria, voiding/incontinence issues) syncope, focal weakness, memory loss, numbness & tingling, skin/hair/nail changes, depression, anxiety, abnormal bruising, musculoskeletal symptoms/signs.   + 7 lb weight loss + rectal bleeding- went to ER yesterday and has GI appt this afternoon + chronic cough    Objective:   Physical Exam General Appearance:    Alert, cooperative, no distress, appears stated age, obese  Head:    Normocephalic, without obvious abnormality, atraumatic  Eyes:    PERRL, conjunctiva/corneas clear,  EOM's intact both eyes       Ears:    Normal TM's and external ear canals, both ears  Nose:   Nares normal, septum midline, mucosa normal, no drainage   or sinus tenderness  Throat:   Lips, mucosa, and tongue normal; teeth and gums normal  Neck:   Supple, symmetrical, trachea midline, no adenopathy;       thyroid:  No enlargement/tenderness/nodules  Back:     Symmetric, no curvature, ROM normal, no CVA tenderness  Lungs:     Clear to auscultation bilaterally, respirations unlabored  Chest wall:    No tenderness or deformity  Heart:    Regular rate and rhythm, S1 and S2 normal, no murmur, rub   or gallop  Abdomen:     Soft, non-tender, bowel sounds active all four quadrants,    no masses, no organomegaly  Genitalia:    deferred  Rectal:    Extremities:   Extremities normal, atraumatic, no cyanosis or edema  Pulses:   2+ and symmetric all extremities  Skin:   Skin color, texture, turgor normal, no rashes or lesions  Lymph nodes:   Cervical, supraclavicular, and axillary nodes normal  Neurologic:   CNII-XII intact. Normal strength, sensation and reflexes      throughout          Assessment & Plan:

## 2024-03-01 NOTE — Assessment & Plan Note (Signed)
 Chronic problem.  Currently diet controlled.  UTD on eye exam, foot exam, microalbumin.  Check labs.  Start meds prn.

## 2024-03-01 NOTE — Patient Instructions (Signed)
 Follow up in 6 months to recheck BP, cholesterol, and sugar We'll notify you of your lab results and make any changes if needed Continue to work on healthy diet and regular exercise- you can do it! Call with any questions or concerns Stay Safe!  Stay Healthy! Hang in there!!!

## 2024-03-01 NOTE — Assessment & Plan Note (Signed)
 Pt's PE WNL w/ exception of BMI.  UTD on colonoscopy, PNA.  Check labs.  Anticipatory guidance provided.

## 2024-03-02 LAB — RHEUMATOID FACTOR: Rheumatoid fact SerPl-aCnc: <10 [IU]/mL (ref <14)

## 2024-03-03 ENCOUNTER — Encounter: Payer: Self-pay | Admitting: Family Medicine

## 2024-03-04 ENCOUNTER — Telehealth: Payer: Self-pay

## 2024-03-04 NOTE — Telephone Encounter (Signed)
-----   Message from Neena Rhymes sent at 03/03/2024  5:07 PM EDT ----- Your cholesterol is better!  This is good news!  Your PSA level is stable  No evidence of Rheumatoid Arthritis- yay!  Remainder of labs are stable and look good  (Please note- I may not comment on every lab value that is noted as abnormal.  Trust when I say I review all of them, compare them to previous values, and put them in the correct medical context.  If I have any concerns- or any follow up or changes are needed- I promise to let you know)

## 2024-03-04 NOTE — Telephone Encounter (Signed)
 Pt has been notified.

## 2024-03-11 DIAGNOSIS — J45901 Unspecified asthma with (acute) exacerbation: Secondary | ICD-10-CM | POA: Diagnosis not present

## 2024-03-11 DIAGNOSIS — J209 Acute bronchitis, unspecified: Secondary | ICD-10-CM | POA: Diagnosis not present

## 2024-03-25 ENCOUNTER — Encounter: Payer: Medicare Other | Admitting: Family Medicine

## 2024-03-26 ENCOUNTER — Encounter (INDEPENDENT_AMBULATORY_CARE_PROVIDER_SITE_OTHER): Admitting: Ophthalmology

## 2024-03-27 ENCOUNTER — Encounter (INDEPENDENT_AMBULATORY_CARE_PROVIDER_SITE_OTHER): Admitting: Ophthalmology

## 2024-03-27 ENCOUNTER — Other Ambulatory Visit: Payer: Self-pay

## 2024-03-27 DIAGNOSIS — H43813 Vitreous degeneration, bilateral: Secondary | ICD-10-CM | POA: Diagnosis not present

## 2024-03-27 DIAGNOSIS — H2513 Age-related nuclear cataract, bilateral: Secondary | ICD-10-CM | POA: Diagnosis not present

## 2024-03-27 DIAGNOSIS — H353112 Nonexudative age-related macular degeneration, right eye, intermediate dry stage: Secondary | ICD-10-CM

## 2024-03-27 DIAGNOSIS — H353221 Exudative age-related macular degeneration, left eye, with active choroidal neovascularization: Secondary | ICD-10-CM | POA: Diagnosis not present

## 2024-03-27 DIAGNOSIS — N529 Male erectile dysfunction, unspecified: Secondary | ICD-10-CM

## 2024-03-27 DIAGNOSIS — I10 Essential (primary) hypertension: Secondary | ICD-10-CM | POA: Diagnosis not present

## 2024-03-27 DIAGNOSIS — H35033 Hypertensive retinopathy, bilateral: Secondary | ICD-10-CM

## 2024-03-27 MED ORDER — TADALAFIL 10 MG PO TABS: 10.0000 mg | ORAL_TABLET | Freq: Every day | ORAL | 6 refills | Status: DC

## 2024-04-23 ENCOUNTER — Encounter (INDEPENDENT_AMBULATORY_CARE_PROVIDER_SITE_OTHER): Admitting: Ophthalmology

## 2024-04-23 DIAGNOSIS — H35033 Hypertensive retinopathy, bilateral: Secondary | ICD-10-CM

## 2024-04-23 DIAGNOSIS — H353221 Exudative age-related macular degeneration, left eye, with active choroidal neovascularization: Secondary | ICD-10-CM | POA: Diagnosis not present

## 2024-04-23 DIAGNOSIS — I10 Essential (primary) hypertension: Secondary | ICD-10-CM

## 2024-04-23 DIAGNOSIS — H43813 Vitreous degeneration, bilateral: Secondary | ICD-10-CM

## 2024-04-23 DIAGNOSIS — H353112 Nonexudative age-related macular degeneration, right eye, intermediate dry stage: Secondary | ICD-10-CM

## 2024-05-21 ENCOUNTER — Encounter (INDEPENDENT_AMBULATORY_CARE_PROVIDER_SITE_OTHER): Admitting: Ophthalmology

## 2024-05-21 DIAGNOSIS — H35033 Hypertensive retinopathy, bilateral: Secondary | ICD-10-CM | POA: Diagnosis not present

## 2024-05-21 DIAGNOSIS — H353112 Nonexudative age-related macular degeneration, right eye, intermediate dry stage: Secondary | ICD-10-CM | POA: Diagnosis not present

## 2024-05-21 DIAGNOSIS — H43813 Vitreous degeneration, bilateral: Secondary | ICD-10-CM

## 2024-05-21 DIAGNOSIS — H353221 Exudative age-related macular degeneration, left eye, with active choroidal neovascularization: Secondary | ICD-10-CM

## 2024-05-21 DIAGNOSIS — I10 Essential (primary) hypertension: Secondary | ICD-10-CM | POA: Diagnosis not present

## 2024-06-18 ENCOUNTER — Encounter (INDEPENDENT_AMBULATORY_CARE_PROVIDER_SITE_OTHER): Admitting: Ophthalmology

## 2024-06-18 DIAGNOSIS — H353221 Exudative age-related macular degeneration, left eye, with active choroidal neovascularization: Secondary | ICD-10-CM

## 2024-06-18 DIAGNOSIS — H43813 Vitreous degeneration, bilateral: Secondary | ICD-10-CM

## 2024-06-18 DIAGNOSIS — I10 Essential (primary) hypertension: Secondary | ICD-10-CM

## 2024-06-18 DIAGNOSIS — H35033 Hypertensive retinopathy, bilateral: Secondary | ICD-10-CM | POA: Diagnosis not present

## 2024-06-18 DIAGNOSIS — H353112 Nonexudative age-related macular degeneration, right eye, intermediate dry stage: Secondary | ICD-10-CM

## 2024-06-18 DIAGNOSIS — H2513 Age-related nuclear cataract, bilateral: Secondary | ICD-10-CM | POA: Diagnosis not present

## 2024-06-19 DIAGNOSIS — H2513 Age-related nuclear cataract, bilateral: Secondary | ICD-10-CM | POA: Diagnosis not present

## 2024-06-19 DIAGNOSIS — H25013 Cortical age-related cataract, bilateral: Secondary | ICD-10-CM | POA: Diagnosis not present

## 2024-06-19 DIAGNOSIS — H353221 Exudative age-related macular degeneration, left eye, with active choroidal neovascularization: Secondary | ICD-10-CM | POA: Diagnosis not present

## 2024-06-19 DIAGNOSIS — H353112 Nonexudative age-related macular degeneration, right eye, intermediate dry stage: Secondary | ICD-10-CM | POA: Diagnosis not present

## 2024-06-19 DIAGNOSIS — H2512 Age-related nuclear cataract, left eye: Secondary | ICD-10-CM | POA: Diagnosis not present

## 2024-07-16 ENCOUNTER — Encounter (INDEPENDENT_AMBULATORY_CARE_PROVIDER_SITE_OTHER): Admitting: Ophthalmology

## 2024-07-16 DIAGNOSIS — I10 Essential (primary) hypertension: Secondary | ICD-10-CM | POA: Diagnosis not present

## 2024-07-16 DIAGNOSIS — H353112 Nonexudative age-related macular degeneration, right eye, intermediate dry stage: Secondary | ICD-10-CM

## 2024-07-16 DIAGNOSIS — H35033 Hypertensive retinopathy, bilateral: Secondary | ICD-10-CM

## 2024-07-16 DIAGNOSIS — H353221 Exudative age-related macular degeneration, left eye, with active choroidal neovascularization: Secondary | ICD-10-CM

## 2024-07-16 DIAGNOSIS — H43813 Vitreous degeneration, bilateral: Secondary | ICD-10-CM | POA: Diagnosis not present

## 2024-07-26 ENCOUNTER — Other Ambulatory Visit: Payer: Self-pay | Admitting: Family Medicine

## 2024-07-26 ENCOUNTER — Other Ambulatory Visit: Payer: Self-pay

## 2024-07-26 DIAGNOSIS — I10 Essential (primary) hypertension: Secondary | ICD-10-CM

## 2024-07-26 MED ORDER — ALLOPURINOL 100 MG PO TABS: ORAL_TABLET | ORAL | 1 refills | Status: DC

## 2024-07-30 DIAGNOSIS — J45909 Unspecified asthma, uncomplicated: Secondary | ICD-10-CM | POA: Diagnosis not present

## 2024-07-30 DIAGNOSIS — I10 Essential (primary) hypertension: Secondary | ICD-10-CM | POA: Diagnosis not present

## 2024-07-30 DIAGNOSIS — H2511 Age-related nuclear cataract, right eye: Secondary | ICD-10-CM | POA: Diagnosis not present

## 2024-07-30 DIAGNOSIS — H25041 Posterior subcapsular polar age-related cataract, right eye: Secondary | ICD-10-CM | POA: Diagnosis not present

## 2024-07-30 DIAGNOSIS — H2512 Age-related nuclear cataract, left eye: Secondary | ICD-10-CM | POA: Diagnosis not present

## 2024-07-30 DIAGNOSIS — H268 Other specified cataract: Secondary | ICD-10-CM | POA: Diagnosis not present

## 2024-07-30 DIAGNOSIS — H25011 Cortical age-related cataract, right eye: Secondary | ICD-10-CM | POA: Diagnosis not present

## 2024-08-06 DIAGNOSIS — I10 Essential (primary) hypertension: Secondary | ICD-10-CM | POA: Diagnosis not present

## 2024-08-06 DIAGNOSIS — H268 Other specified cataract: Secondary | ICD-10-CM | POA: Diagnosis not present

## 2024-08-06 DIAGNOSIS — H2511 Age-related nuclear cataract, right eye: Secondary | ICD-10-CM | POA: Diagnosis not present

## 2024-08-06 DIAGNOSIS — J45909 Unspecified asthma, uncomplicated: Secondary | ICD-10-CM | POA: Diagnosis not present

## 2024-08-13 ENCOUNTER — Encounter (INDEPENDENT_AMBULATORY_CARE_PROVIDER_SITE_OTHER): Admitting: Ophthalmology

## 2024-08-13 DIAGNOSIS — H353112 Nonexudative age-related macular degeneration, right eye, intermediate dry stage: Secondary | ICD-10-CM | POA: Diagnosis not present

## 2024-08-13 DIAGNOSIS — H35033 Hypertensive retinopathy, bilateral: Secondary | ICD-10-CM

## 2024-08-13 DIAGNOSIS — H353221 Exudative age-related macular degeneration, left eye, with active choroidal neovascularization: Secondary | ICD-10-CM

## 2024-08-13 DIAGNOSIS — H43813 Vitreous degeneration, bilateral: Secondary | ICD-10-CM | POA: Diagnosis not present

## 2024-08-13 DIAGNOSIS — I10 Essential (primary) hypertension: Secondary | ICD-10-CM

## 2024-09-02 ENCOUNTER — Ambulatory Visit: Admitting: Family Medicine

## 2024-09-03 ENCOUNTER — Telehealth: Payer: Self-pay

## 2024-09-03 DIAGNOSIS — R0981 Nasal congestion: Secondary | ICD-10-CM | POA: Diagnosis not present

## 2024-09-03 DIAGNOSIS — R051 Acute cough: Secondary | ICD-10-CM | POA: Diagnosis not present

## 2024-09-03 NOTE — Telephone Encounter (Signed)
 Pt reports he was seen at [facility] today and was dx with COVID today. Pt reports sx started Sunday. Pt reports they could not send in a rx that he needed to reach out to his PCP.

## 2024-09-03 NOTE — Telephone Encounter (Signed)
 Pt has appt with Dr.Greene tomorrow at 2pm. He is going to call back to [facility] to find out why they can not send in this refill.

## 2024-09-03 NOTE — Telephone Encounter (Unsigned)
 Copied from CRM [id number]. Topic: Clinical - Medication Question >> Sep 03, 2024 12:55 PM [place] E wrote: Reason for CRM: Patient's wife, Inocente, called in stating that her husband tested positive for Covid. Inocente is currently out of town, but her husband was questioning if a prescription of Paxlovid could be sent in for him. Callback number for Inocente is [phone].

## 2024-09-04 ENCOUNTER — Ambulatory Visit: Admitting: Family Medicine

## 2024-09-10 ENCOUNTER — Encounter (INDEPENDENT_AMBULATORY_CARE_PROVIDER_SITE_OTHER): Admitting: Ophthalmology

## 2024-09-10 ENCOUNTER — Ambulatory Visit (INDEPENDENT_AMBULATORY_CARE_PROVIDER_SITE_OTHER): Payer: Medicare Other | Admitting: *Deleted

## 2024-09-10 VITALS — Ht 70.0 in | Wt 262.0 lb

## 2024-09-10 DIAGNOSIS — I10 Essential (primary) hypertension: Secondary | ICD-10-CM

## 2024-09-10 DIAGNOSIS — H43813 Vitreous degeneration, bilateral: Secondary | ICD-10-CM | POA: Diagnosis not present

## 2024-09-10 DIAGNOSIS — H353221 Exudative age-related macular degeneration, left eye, with active choroidal neovascularization: Secondary | ICD-10-CM

## 2024-09-10 DIAGNOSIS — H353112 Nonexudative age-related macular degeneration, right eye, intermediate dry stage: Secondary | ICD-10-CM | POA: Diagnosis not present

## 2024-09-10 DIAGNOSIS — H35033 Hypertensive retinopathy, bilateral: Secondary | ICD-10-CM | POA: Diagnosis not present

## 2024-09-10 DIAGNOSIS — Z0000 Encounter for general adult medical examination without abnormal findings: Secondary | ICD-10-CM

## 2024-09-10 LAB — HM DIABETES EYE EXAM

## 2024-09-10 NOTE — Patient Instructions (Signed)
 Jason Bartlett , Thank you for taking time to come for your Medicare Wellness Visit. I appreciate your ongoing commitment to your health goals. Please review the following plan we discussed and let me know if I can assist you in the future.   Screening recommendations/referrals: Colonoscopy: up to date Recommended yearly ophthalmology/optometry visit for glaucoma screening and checkup Recommended yearly dental visit for hygiene and checkup  Vaccinations: Influenza vaccine: education provided Pneumococcal vaccine: up tod ate Tdap vaccine: up to date Shingles vaccine: up to date        Preventive Care 65 Years and Older, Male Preventive care refers to lifestyle choices and visits with your health care provider that can promote health and wellness. What does preventive care include? A yearly physical exam. This is also called an annual well check. Dental exams once or twice a year. Routine eye exams. Ask your health care provider how often you should have your eyes checked. Personal lifestyle choices, including: Daily care of your teeth and gums. Regular physical activity. Eating a healthy diet. Avoiding tobacco and drug use. Limiting alcohol use. Practicing safe sex. Taking low doses of aspirin  every day. Taking vitamin and mineral supplements as recommended by your health care provider. What happens during an annual well check? The services and screenings done by your health care provider during your annual well check will depend on your age, overall health, lifestyle risk factors, and family history of disease. Counseling  Your health care provider may ask you questions about your: Alcohol use. Tobacco use. Drug use. Emotional well-being. Home and relationship well-being. Sexual activity. Eating habits. History of falls. Memory and ability to understand (cognition). Work and work Astronomer. Screening  You may have the following tests or measurements: Height, weight,  and BMI. Blood pressure. Lipid and cholesterol levels. These may be checked every 5 years, or more frequently if you are over 33 years old. Skin check. Lung cancer screening. You may have this screening every year starting at age 39 if you have a 30-pack-year history of smoking and currently smoke or have quit within the past 15 years. Fecal occult blood test (FOBT) of the stool. You may have this test every year starting at age 21. Flexible sigmoidoscopy or colonoscopy. You may have a sigmoidoscopy every 5 years or a colonoscopy every 10 years starting at age 23. Prostate cancer screening. Recommendations will vary depending on your family history and other risks. Hepatitis C blood test. Hepatitis B blood test. Sexually transmitted disease (STD) testing. Diabetes screening. This is done by checking your blood sugar (glucose) after you have not eaten for a while (fasting). You may have this done every 1-3 years. Abdominal aortic aneurysm (AAA) screening. You may need this if you are a current or former smoker. Osteoporosis. You may be screened starting at age 29 if you are at high risk. Talk with your health care provider about your test results, treatment options, and if necessary, the need for more tests. Vaccines  Your health care provider may recommend certain vaccines, such as: Influenza vaccine. This is recommended every year. Tetanus, diphtheria, and acellular pertussis (Tdap, Td) vaccine. You may need a Td booster every 10 years. Zoster vaccine. You may need this after age 68. Pneumococcal 13-valent conjugate (PCV13) vaccine. One dose is recommended after age 81. Pneumococcal polysaccharide (PPSV23) vaccine. One dose is recommended after age 29. Talk to your health care provider about which screenings and vaccines you need and how often you need them. This information is not  intended to replace advice given to you by your health care provider. Make sure you discuss any questions you  have with your health care provider. Document Released: 01/08/2016 Document Revised: 08/31/2016 Document Reviewed: 10/13/2015 Elsevier Interactive Patient Education  2017 [employer].  Fall Prevention in the Home Falls can cause injuries. They can happen to people of all ages. There are many things you can do to make your home safe and to help prevent falls. What can I do on the outside of my home? Regularly fix the edges of walkways and driveways and fix any cracks. Remove anything that might make you trip as you walk through a door, such as a raised step or threshold. Trim any bushes or trees on the path to your home. Use bright outdoor lighting. Clear any walking paths of anything that might make someone trip, such as rocks or tools. Regularly check to see if handrails are loose or broken. Make sure that both sides of any steps have handrails. Any raised decks and porches should have guardrails on the edges. Have any leaves, snow, or ice cleared regularly. Use sand or salt on walking paths during winter. Clean up any spills in your garage right away. This includes oil or grease spills. What can I do in the bathroom? Use night lights. Install grab bars by the toilet and in the tub and shower. Do not use towel bars as grab bars. Use non-skid mats or decals in the tub or shower. If you need to sit down in the shower, use a plastic, non-slip stool. Keep the floor dry. Clean up any water that spills on the floor as soon as it happens. Remove soap buildup in the tub or shower regularly. Attach bath mats securely with double-sided non-slip rug tape. Do not have throw rugs and other things on the floor that can make you trip. What can I do in the bedroom? Use night lights. Make sure that you have a light by your bed that is easy to reach. Do not use any sheets or blankets that are too big for your bed. They should not hang down onto the floor. Have a firm chair that has side arms. You can  use this for support while you get dressed. Do not have throw rugs and other things on the floor that can make you trip. What can I do in the kitchen? Clean up any spills right away. Avoid walking on wet floors. Keep items that you use a lot in easy-to-reach places. If you need to reach something above you, use a strong step stool that has a grab bar. Keep electrical cords out of the way. Do not use floor polish or wax that makes floors slippery. If you must use wax, use non-skid floor wax. Do not have throw rugs and other things on the floor that can make you trip. What can I do with my stairs? Do not leave any items on the stairs. Make sure that there are handrails on both sides of the stairs and use them. Fix handrails that are broken or loose. Make sure that handrails are as long as the stairways. Check any carpeting to make sure that it is firmly attached to the stairs. Fix any carpet that is loose or worn. Avoid having throw rugs at the top or bottom of the stairs. If you do have throw rugs, attach them to the floor with carpet tape. Make sure that you have a light switch at the top of the stairs  and the bottom of the stairs. If you do not have them, ask someone to add them for you. What else can I do to help prevent falls? Wear shoes that: Do not have high heels. Have rubber bottoms. Are comfortable and fit you well. Are closed at the toe. Do not wear sandals. If you use a stepladder: Make sure that it is fully opened. Do not climb a closed stepladder. Make sure that both sides of the stepladder are locked into place. Ask someone to hold it for you, if possible. Clearly mark and make sure that you can see: Any grab bars or handrails. First and last steps. Where the edge of each step is. Use tools that help you move around (mobility aids) if they are needed. These include: Canes. Walkers. Scooters. Crutches. Turn on the lights when you go into a dark area. Replace any light  bulbs as soon as they burn out. Set up your furniture so you have a clear path. Avoid moving your furniture around. If any of your floors are uneven, fix them. If there are any pets around you, be aware of where they are. Review your medicines with your doctor. Some medicines can make you feel dizzy. This can increase your chance of falling. Ask your doctor what other things that you can do to help prevent falls. This information is not intended to replace advice given to you by your health care provider. Make sure you discuss any questions you have with your health care provider. Document Released: 10/08/2009 Document Revised: 05/19/2016 Document Reviewed: 01/16/2015 Elsevier Interactive Patient Education  2017 [employer].

## 2024-09-10 NOTE — Progress Notes (Signed)
 Subjective:   Jason Bartlett is a 68 y.o. male who presents for Medicare Annual/Subsequent preventive examination.  Visit Complete: Virtual I connected with  Jason Bartlett on 09/10/24 by a audio enabled telemedicine application and verified that I am speaking with the correct person using two identifiers.  Patient Location: Home  Provider Location: Home Office  I discussed the limitations of evaluation and management by telemedicine. The patient expressed understanding and agreed to proceed.  Vital Signs: Because this visit was a virtual/telehealth visit, some criteria may be missing or patient reported. Any vitals not documented were not able to be obtained and vitals that have been documented are patient reported.  Patient Medicare AWV questionnaire was completed by the patient on 09-09-2024; I have confirmed that all information answered by patient is correct and no changes since this date.  Cardiac Risk Factors include: advanced age (>37men, >66 women);male gender;hypertension;obesity (BMI >30kg/m2)     Objective:    Today's Vitals   09/10/24 1518  Weight: 262 lb (118.8 kg)  Height: 5' 10 (1.778 m)   Body mass index is 37.59 kg/m.     09/10/2024    3:21 PM 02/29/2024   12:00 PM 08/30/2023    9:32 AM 11/30/2022   12:14 PM 02/07/2021    5:30 PM 10/31/2019    9:41 AM 09/12/2018   12:30 PM  Advanced Directives  Does Patient Have a Medical Advance Directive? No No Yes No No No No   Type of Advance Directive   Healthcare Power of Attorney      Does patient want to make changes to medical advance directive?   No - Patient declined      Copy of Healthcare Power of Attorney in Chart?   No - copy requested      Would patient like information on creating a medical advance directive? No - Patient declined   No - Patient declined   No - Patient declined      Data saved with a previous flowsheet row definition    Current Medications (verified) Outpatient Encounter Medications  as of 09/10/2024  Medication Sig   acetaminophen (TYLENOL) 500 MG tablet Take 1,000 mg by mouth every 6 (six) hours as needed for moderate pain.   albuterol  (VENTOLIN  HFA) 108 (90 Base) MCG/ACT inhaler Inhale 2 puffs into the lungs every 6 (six) hours as needed for wheezing or shortness of breath.   allopurinol  (ZYLOPRIM ) 100 MG tablet TAKE 1 TABLET(100 MG) BY MOUTH DAILY   ciprofloxacin (CILOXAN) 0.3 % ophthalmic solution SMARTSIG:In Eye(s)   fluticasone  (FLOVENT  HFA) 110 MCG/ACT inhaler INHALE 1 PUFF INTO THE LUNGS IN THE MORNING AND AT BEDTIME   lisinopril -hydrochlorothiazide  (ZESTORETIC ) 20-12.5 MG tablet TAKE 1 TABLET BY MOUTH DAILY   omeprazole  (PRILOSEC) 40 MG capsule TAKE 1 CAPSULE(40 MG) BY MOUTH DAILY   tadalafil  (CIALIS ) 10 MG tablet Take 1 tablet (10 mg total) by mouth daily.   No facility-administered encounter medications on file as of 09/10/2024.    Allergies (verified) Penicillins and Sulfa antibiotics   History: Past Medical History:  Diagnosis Date   GERD (gastroesophageal reflux disease)    History of chicken pox    Hyperlipidemia    Hypertension    Prostate cancer [facility])    Past Surgical History:  Procedure Laterality Date   EXTERNAL EAR SURGERY     PROSTATE SURGERY     Family History  Problem Relation Age of Onset   Cancer Mother  breast   Dementia Father    Hearing loss Father    Cancer Father        bone cancer   Hearing loss Brother    Early death Paternal Grandfather    Heart attack Paternal Grandfather    Social History   Socioeconomic History   Marital status: Married    Spouse name: Not on file   Number of children: Not on file   Years of education: Not on file   Highest education level: Associate degree: occupational, Scientist, product/process development, or vocational program  Occupational History    Employer: [employer]  Tobacco Use   Smoking status: Never   Smokeless tobacco: Never  Vaping Use   Vaping status: Never Used  Substance and Sexual  Activity   Alcohol use: Not Currently    Comment: social beer   Drug use: No   Sexual activity: Yes  Other Topics Concern   Not on file  Social History Narrative   Not on file   Social Drivers of Health   Financial Resource Strain: Low Risk  (09/10/2024)   Overall Financial Resource Strain (CARDIA)    Difficulty of Paying Living Expenses: Not hard at all  Food Insecurity: No Food Insecurity (09/10/2024)   Hunger Vital Sign    Worried About Running Out of Food in the Last Year: Never true    Ran Out of Food in the Last Year: Never true  Transportation Needs: No Transportation Needs (09/10/2024)   PRAPARE - Administrator, Civil Service (Medical): No    Lack of Transportation (Non-Medical): No  Physical Activity: Insufficiently Active (09/10/2024)   Exercise Vital Sign    Days of Exercise per Week: 2 days    Minutes of Exercise per Session: 20 min  Stress: No Stress Concern Present (09/10/2024)   [employer] of Occupational Health - Occupational Stress Questionnaire    Feeling of Stress: Not at all  Social Connections: Moderately Integrated (09/10/2024)   Social Connection and Isolation Panel    Frequency of Communication with Friends and Family: More than three times a week    Frequency of Social Gatherings with Friends and Family: Twice a week    Attends Religious Services: Never    Database administrator or Organizations: No    Attends Engineer, structural: More than 4 times per year    Marital Status: Living with partner  Recent Concern: Social Connections - Moderately Isolated (08/28/2024)   Social Connection and Isolation Panel    Frequency of Communication with Friends and Family: More than three times a week    Frequency of Social Gatherings with Friends and Family: Twice a week    Attends Religious Services: Never    Database administrator or Organizations: No    Attends Engineer, structural: Not on file    Marital Status: Living with  partner    Tobacco Counseling Counseling given: Not Answered   Clinical Intake:  Pre-visit preparation completed: Yes  Pain : No/denies pain     Diabetes: No  How often do you need to have someone help you when you read instructions, pamphlets, or other written materials from your doctor or pharmacy?: 1 - Never  Interpreter Needed?: No  Information entered by :: Mliss Graff LPN   Activities of Daily Living    09/10/2024    3:17 PM 09/09/2024    3:22 PM  In your present state of health, do you have any difficulty performing  the following activities:  Hearing? 0 0  Vision? 0 0  Difficulty concentrating or making decisions? 0 0  Walking or climbing stairs? 0 0  Dressing or bathing? 0 0  Doing errands, shopping? 0 0  Preparing Food and eating ? N N  Using the Toilet? N N  In the past six months, have you accidently leaked urine? N N  Do you have problems with loss of bowel control? N N  Managing your Medications? N N  Managing your Finances? N N  Housekeeping or managing your Housekeeping? N N    Patient Care Team: Mahlon Comer BRAVO, MD as PCP - General (Family Medicine) McKenzie, Belvie CROME, MD as Consulting Physician (Urology) Regal, Pasco RAMAN, DPM as Consulting Physician (Podiatry) Alvia Norleen BIRCH, MD as Consulting Physician (Ophthalmology)  Indicate any recent Medical Services you may have received from other than [facility] providers in the past year (date may be approximate).     Assessment:   This is a routine wellness examination for Hayden.  Hearing/Vision screen Hearing Screening - Comments:: Hearing aid right ear  Vision Screening - Comments:: Up to date matthews   Goals Addressed             This Visit's Progress    Increase physical activity   Not on track    Increase physical activity   Not on track    Patient Stated       Stay healthy       Depression Screen    09/10/2024    3:20 PM 03/01/2024    9:50 AM 01/22/2024   10:33 AM  12/14/2023   10:07 AM 08/31/2023    7:49 AM 08/30/2023    9:08 AM 02/28/2023    7:51 AM  PHQ 2/9 Scores  PHQ - 2 Score 0 0 0 0 0 0 0  PHQ- 9 Score 2 1 0 0 1 1 0    Fall Risk    09/10/2024    4:14 PM 09/10/2024    3:19 PM 09/09/2024    3:22 PM 03/01/2024    9:50 AM 01/22/2024   10:32 AM  Fall Risk   Falls in the past year? 0 0 0 0 0  Number falls in past yr: 0  0 0 0  Injury with Fall? 0  0 0 0  Risk for fall due to :    No Fall Risks   Follow up Falls evaluation completed;Education provided;Falls prevention discussed   Falls evaluation completed     MEDICARE RISK AT HOME: Medicare Risk at Home Any stairs in or around the home?: No Home free of loose throw rugs in walkways, pet beds, electrical cords, etc?: Yes Adequate lighting in your home to reduce risk of falls?: Yes Life alert?: No Use of a cane, walker or w/c?: No Grab bars in the bathroom?: No Shower chair or bench in shower?: No Elevated toilet seat or a handicapped toilet?: No  TIMED UP AND GO:  Was the test performed?  No    Cognitive Function:        09/10/2024    4:14 PM 08/30/2023    9:06 AM 11/30/2022   12:24 PM  6CIT Screen  What Year? 0 points 0 points 0 points  What month? 0 points 0 points 0 points  What time? 0 points 0 points 0 points  Count back from 20 0 points 0 points 0 points  Months in reverse 0 points 2 points 0 points  Repeat phrase  0 points 2 points 0 points  Total Score 0 points 4 points 0 points    Immunizations Immunization History  Administered Date(s) Administered   Fluad Trivalent(High Dose 65+) 08/31/2023   Influenza Inj Mdck Quad Pf 10/02/2019   Influenza,inj,Quad PF,6+ Mos 10/08/2017, 09/16/2018   Influenza,trivalent, recombinat, inj, PF 10/06/2022   Influenza-Unspecified 09/25/2021   PFIZER(Purple Top)SARS-COV-2 Vaccination 03/04/2020, 03/25/2020   PNEUMOCOCCAL CONJUGATE-20 08/31/2023   Tdap 02/08/2018   Zoster Recombinant(Shingrix ) 03/13/2019, 10/15/2019    TDAP status: Up  to date  Flu Vaccine status: Due, Education has been provided regarding the importance of this vaccine. Advised may receive this vaccine at local pharmacy or Health Dept. Aware to provide a copy of the vaccination record if obtained from local pharmacy or Health Dept. Verbalized acceptance and understanding.  Pneumococcal vaccine status: Up to date  Covid-19 vaccine status: Declined, Education has been provided regarding the importance of this vaccine but patient still declined. Advised may receive this vaccine at local pharmacy or Health Dept.or vaccine clinic. Aware to provide a copy of the vaccination record if obtained from local pharmacy or Health Dept. Verbalized acceptance and understanding.  Qualifies for Shingles Vaccine? No   Zostavax completed Yes   Shingrix  Completed?: Yes  Screening Tests Health Maintenance  Topic Date Due   Diabetic kidney evaluation - Urine ACR  Never done   OPHTHALMOLOGY EXAM  07/09/2024   Influenza Vaccine  07/26/2024   HEMOGLOBIN A1C  09/01/2024   FOOT EXAM  12/13/2024   Diabetic kidney evaluation - eGFR measurement  02/28/2025   Medicare Annual Wellness (AWV)  09/10/2025   DTaP/Tdap/Td (2 - Td or Tdap) 02/09/2028   Colonoscopy  10/03/2033   Pneumococcal Vaccine: 50+ Years  Completed   Hepatitis C Screening  Completed   Zoster Vaccines- Shingrix   Completed   HPV VACCINES  Aged Out   Meningococcal B Vaccine  Aged Out   COVID-19 Vaccine  Discontinued    Health Maintenance  Health Maintenance Due  Topic Date Due   Diabetic kidney evaluation - Urine ACR  Never done   OPHTHALMOLOGY EXAM  07/09/2024   Influenza Vaccine  07/26/2024   HEMOGLOBIN A1C  09/01/2024    Colorectal cancer screening: No longer required.   Lung Cancer Screening: (Low Dose CT Chest recommended if Age 90-80 years, 20 pack-year currently smoking OR have quit w/in 15years.) does not qualify.   Lung Cancer Screening Referral:   Additional Screening:  Hepatitis C  Screening: does not qualify; Completed 2024  Vision Screening: Recommended annual ophthalmology exams for early detection of glaucoma and other disorders of the eye. Is the patient up to date with their annual eye exam?  Yes  Who is the provider or what is the name of the office in which the patient attends annual eye exams? [facility] If pt is not established with a provider, would they like to be referred to a provider to establish care? No .   Dental Screening: Recommended annual dental exams for proper oral hygiene    Community Resource Referral / Chronic Care Management: CRR required this visit?  No   CCM required this visit?  No     Plan:     I have personally reviewed and noted the following in the patient's chart:   Medical and social history Use of alcohol, tobacco or illicit drugs  Current medications and supplements including opioid prescriptions. Patient is not currently taking opioid prescriptions. Functional ability and status Nutritional status Physical activity Advanced directives List of  other physicians Hospitalizations, surgeries, and ER visits in previous 12 months Vitals Screenings to include cognitive, depression, and falls Referrals and appointments  In addition, I have reviewed and discussed with patient certain preventive protocols, quality metrics, and best practice recommendations. A written personalized care plan for preventive services as well as general preventive health recommendations were provided to patient.     Mliss Graff, LPN   0/[id number]   After Visit Summary: (MyChart) Due to this being a telephonic visit, the after visit summary with patients personalized plan was offered to patient via MyChart   Nurse Notes:

## 2024-09-18 ENCOUNTER — Ambulatory Visit: Admitting: Family Medicine

## 2024-09-18 ENCOUNTER — Encounter: Payer: Self-pay | Admitting: Family Medicine

## 2024-09-18 VITALS — BP 102/64 | HR 92 | Temp 98.0°F | Ht 70.0 in | Wt 265.0 lb

## 2024-09-18 DIAGNOSIS — I10 Essential (primary) hypertension: Secondary | ICD-10-CM

## 2024-09-18 DIAGNOSIS — E119 Type 2 diabetes mellitus without complications: Secondary | ICD-10-CM | POA: Diagnosis not present

## 2024-09-18 LAB — HEPATIC FUNCTION PANEL
ALT: 20 U/L (ref 0–53)
AST: 21 U/L (ref 0–37)
Albumin: 4.3 g/dL (ref 3.5–5.2)
Alkaline Phosphatase: 37 U/L — ABNORMAL LOW (ref 39–117)
Bilirubin, Direct: 0.2 mg/dL (ref 0.0–0.3)
Total Bilirubin: 0.8 mg/dL (ref 0.2–1.2)
Total Protein: 7.6 g/dL (ref 6.0–8.3)

## 2024-09-18 LAB — LIPID PANEL
Cholesterol: 160 mg/dL (ref 0–200)
HDL: 36.2 mg/dL — ABNORMAL LOW (ref >39.00)
LDL Cholesterol: 91 mg/dL (ref 0–99)
NonHDL: 124
Total CHOL/HDL Ratio: 4
Triglycerides: 163 mg/dL — ABNORMAL HIGH (ref 0.0–149.0)
VLDL: 32.6 mg/dL (ref 0.0–40.0)

## 2024-09-18 LAB — CBC WITH DIFFERENTIAL/PLATELET
Basophils Absolute: 0.1 K/uL (ref 0.0–0.1)
Basophils Relative: 0.9 % (ref 0.0–3.0)
Eosinophils Absolute: 0.7 K/uL (ref 0.0–0.7)
Eosinophils Relative: 8.4 % — ABNORMAL HIGH (ref 0.0–5.0)
HCT: 42.6 % (ref 39.0–52.0)
Hemoglobin: 14.4 g/dL (ref 13.0–17.0)
Lymphocytes Relative: 17.4 % (ref 12.0–46.0)
Lymphs Abs: 1.4 K/uL (ref 0.7–4.0)
MCHC: 33.7 g/dL (ref 30.0–36.0)
MCV: 89.2 fl (ref 78.0–100.0)
Monocytes Absolute: 0.7 K/uL (ref 0.1–1.0)
Monocytes Relative: 9.7 % (ref 3.0–12.0)
Neutro Abs: 4.9 K/uL (ref 1.4–7.7)
Neutrophils Relative %: 63.6 % (ref 43.0–77.0)
Platelets: 172 K/uL (ref 150.0–400.0)
RBC: 4.78 Mil/uL (ref 4.22–5.81)
RDW: 14.8 % (ref 11.5–15.5)
WBC: 7.8 K/uL (ref 4.0–10.5)

## 2024-09-18 LAB — HEMOGLOBIN A1C: Hgb A1c MFr Bld: 6.9 % — ABNORMAL HIGH (ref 4.6–6.5)

## 2024-09-18 LAB — BASIC METABOLIC PANEL WITH GFR
BUN: 15 mg/dL (ref 6–23)
CO2: 27 meq/L (ref 19–32)
Calcium: 9.5 mg/dL (ref 8.4–10.5)
Chloride: 98 meq/L (ref 96–112)
Creatinine, Ser: 1.06 mg/dL (ref 0.40–1.50)
GFR: 71.03 mL/min (ref >60.00)
Glucose, Bld: 117 mg/dL — ABNORMAL HIGH (ref 70–99)
Potassium: 4 meq/L (ref 3.5–5.1)
Sodium: 134 meq/L — ABNORMAL LOW (ref 135–145)

## 2024-09-18 LAB — TSH: TSH: 2.39 u[IU]/mL (ref 0.35–5.50)

## 2024-09-18 MED ORDER — ALBUTEROL SULFATE HFA 108 (90 BASE) MCG/ACT IN AERS: 2.0000 | INHALATION_SPRAY | Freq: Four times a day (QID) | RESPIRATORY_TRACT | 3 refills | Status: DC | PRN

## 2024-09-18 NOTE — Assessment & Plan Note (Signed)
 Chronic problem.  Excellent control on Lisinopril  hydrochlorothiazide  20/12.5mg  daily.  Currently asymptomatic.  Check labs due to ACE and diuretic use but no anticipated med changes.  Will follow.

## 2024-09-18 NOTE — Patient Instructions (Signed)
Schedule your complete physical in 6 months We'll notify you of your lab results and make any changes if needed Continue to work on healthy diet and regular exercise- you can do it! Call with any questions or concerns Stay Safe!  Stay Healthy! Happy Fall!!! 

## 2024-09-18 NOTE — Progress Notes (Signed)
   Subjective:    Patient ID: Jason Bartlett, male    DOB: [birth year], 68 y.o.   MRN: [id number]  HPI HTN- chronic problem, on Lisinopril  hydrochlorothiazide  20/12.5mg  daily w/ good control.  No CP, SOB, HA's, abd pain, N/V, edema.  DM- chronic problem.  Currently diet controlled.  Last A1C 6.4%.  UTD on eye exam (requested), foot exam, due for microalbumin.  On ACE for renal protection.  S/p cataract surgery bilaterally.  No numbness/tingling of hands/feet- 'but my feet just don't feel right'.  Morbid obesity- weight is stable.  BMI 38.  No regular exercise recently b/c of cataract surgery, COVID.   Review of Systems For ROS see HPI     Objective:   Physical Exam Vitals reviewed.  Constitutional:      General: He is not in acute distress.    Appearance: Normal appearance. He is well-developed. He is obese. He is not ill-appearing.  HENT:     Head: Normocephalic and atraumatic.  Eyes:     Extraocular Movements: Extraocular movements intact.     Conjunctiva/sclera: Conjunctivae normal.     Pupils: Pupils are equal, round, and reactive to light.  Neck:     Thyroid : No thyromegaly.  Cardiovascular:     Rate and Rhythm: Normal rate and regular rhythm.     Pulses: Normal pulses.     Heart sounds: Normal heart sounds. No murmur heard. Pulmonary:     Effort: Pulmonary effort is normal. No respiratory distress.     Breath sounds: Normal breath sounds.  Abdominal:     General: Bowel sounds are normal. There is no distension.     Palpations: Abdomen is soft.  Musculoskeletal:     Cervical back: Normal range of motion and neck supple.     Right lower leg: No edema.     Left lower leg: No edema.  Lymphadenopathy:     Cervical: No cervical adenopathy.  Skin:    General: Skin is warm and dry.  Neurological:     General: No focal deficit present.     Mental Status: He is alert and oriented to person, place, and time.     Cranial Nerves: No cranial nerve deficit.  Psychiatric:         Mood and Affect: Mood normal.        Behavior: Behavior normal.           Assessment & Plan:

## 2024-09-18 NOTE — Assessment & Plan Note (Signed)
 Ongoing issue.  Weight and BMI are stable.  BMI 38 but given his HTN and DM this qualifies as morbidly obese.  Encouraged low carb diet and regular exercise.  Will continue to follow.

## 2024-09-18 NOTE — Assessment & Plan Note (Signed)
 Chronic problem.  Currently diet controlled.  On ACE for renal protection.  UTD on eye exam, foot exam.  Will repeat microalbumin.  Check labs.  Start meds if needed.

## 2024-09-19 ENCOUNTER — Ambulatory Visit: Payer: Self-pay | Admitting: Family Medicine

## 2024-09-19 LAB — MICROALBUMIN / CREATININE URINE RATIO
Creatinine,U: 80.3 mg/dL
Microalb Creat Ratio: 25.4 mg/g (ref 0.0–30.0)
Microalb, Ur: 2 mg/dL — ABNORMAL HIGH (ref 0.0–1.9)

## 2024-09-19 NOTE — Progress Notes (Signed)
 Pt has been notified.

## 2024-09-19 NOTE — Progress Notes (Signed)
 Pt reports he has been using cough drops for 2 months and they have sugar in them.

## 2024-09-19 NOTE — Progress Notes (Signed)
 Pt would like to know if he can take anything for the cough he has had for few months. He states he did speak with Dr.Tabori about this.

## 2024-10-08 ENCOUNTER — Encounter (INDEPENDENT_AMBULATORY_CARE_PROVIDER_SITE_OTHER): Admitting: Ophthalmology

## 2024-10-08 DIAGNOSIS — H43813 Vitreous degeneration, bilateral: Secondary | ICD-10-CM | POA: Diagnosis not present

## 2024-10-08 DIAGNOSIS — H353221 Exudative age-related macular degeneration, left eye, with active choroidal neovascularization: Secondary | ICD-10-CM

## 2024-10-08 DIAGNOSIS — H353112 Nonexudative age-related macular degeneration, right eye, intermediate dry stage: Secondary | ICD-10-CM

## 2024-10-08 DIAGNOSIS — H35033 Hypertensive retinopathy, bilateral: Secondary | ICD-10-CM

## 2024-10-08 DIAGNOSIS — I10 Essential (primary) hypertension: Secondary | ICD-10-CM

## 2024-10-17 DIAGNOSIS — I10 Essential (primary) hypertension: Secondary | ICD-10-CM | POA: Diagnosis not present

## 2024-10-17 DIAGNOSIS — R0601 Orthopnea: Secondary | ICD-10-CM | POA: Diagnosis not present

## 2024-10-17 DIAGNOSIS — J208 Acute bronchitis due to other specified organisms: Secondary | ICD-10-CM | POA: Diagnosis not present

## 2024-10-17 DIAGNOSIS — B9689 Other specified bacterial agents as the cause of diseases classified elsewhere: Secondary | ICD-10-CM | POA: Diagnosis not present

## 2024-10-17 DIAGNOSIS — R052 Subacute cough: Secondary | ICD-10-CM | POA: Diagnosis not present

## 2024-10-18 DIAGNOSIS — I10 Essential (primary) hypertension: Secondary | ICD-10-CM | POA: Diagnosis not present

## 2024-10-18 DIAGNOSIS — I517 Cardiomegaly: Secondary | ICD-10-CM | POA: Diagnosis not present

## 2024-10-18 DIAGNOSIS — I3139 Other pericardial effusion (noninflammatory): Secondary | ICD-10-CM | POA: Diagnosis not present

## 2024-10-18 DIAGNOSIS — R0601 Orthopnea: Secondary | ICD-10-CM | POA: Diagnosis not present

## 2024-10-18 DIAGNOSIS — R052 Subacute cough: Secondary | ICD-10-CM | POA: Diagnosis not present

## 2024-10-24 ENCOUNTER — Encounter: Payer: Self-pay | Admitting: Family Medicine

## 2024-10-24 ENCOUNTER — Ambulatory Visit (INDEPENDENT_AMBULATORY_CARE_PROVIDER_SITE_OTHER): Admitting: Family Medicine

## 2024-10-24 VITALS — BP 124/70 | HR 96 | Temp 98.0°F | Ht 70.0 in | Wt 271.2 lb

## 2024-10-24 DIAGNOSIS — R053 Chronic cough: Secondary | ICD-10-CM | POA: Diagnosis not present

## 2024-10-24 MED ORDER — LEVOCETIRIZINE DIHYDROCHLORIDE 5 MG PO TABS: 5.0000 mg | ORAL_TABLET | Freq: Every evening | ORAL | 1 refills | Status: AC

## 2024-10-24 MED ORDER — IRBESARTAN-HYDROCHLOROTHIAZIDE 150-12.5 MG PO TABS: 1.0000 | ORAL_TABLET | Freq: Every day | ORAL | 1 refills | Status: AC

## 2024-10-24 NOTE — Progress Notes (Signed)
   Subjective:    Patient ID: Jason Bartlett, male    DOB: [birth year], 68 y.o.   MRN: [id number]  HPI Hospital f/u- pt was admitted 10/23-24 after developing PVCs at [facility] while being evaluated for ongoing cough.  Had ECHO done to assess for CHF- normal systolic and diastolic fxn.  CXR unremarkable.  He was started on Flonase  and Claritin was changed to Xyzal in case cough was due to seasonal allergies.  Was started on Prednisone  due to wheezing.  ACE was switched to ARB/hydrochlorothiazide  combo.  It was recommended he f/u w/ allergy and pulmonary (already saw Pulmonary).  Prior to D/C pt had episode of coughing spell and eyes rolled back and he became slightly rigid and non responsive for 10-15 seconds.  No post ictal phase.  Hospital did not feel this was a seizure but thought he may have become hypoxic and had a vagal episode.  Today pt reports feeling 'much better'.  Is aware that this may be due to Prednisone .  Not coughing constantly.  Able to take a deep breath.   Review of Systems For ROS see HPI     Objective:   Physical Exam Vitals reviewed.  Constitutional:      General: He is not in acute distress.    Appearance: Normal appearance. He is well-developed. He is not ill-appearing.  HENT:     Head: Normocephalic and atraumatic.     Right Ear: Tympanic membrane normal.     Left Ear: Tympanic membrane normal.     Nose: No mucosal edema or rhinorrhea.     Right Sinus: No maxillary sinus tenderness or frontal sinus tenderness.     Left Sinus: No maxillary sinus tenderness or frontal sinus tenderness.     Mouth/Throat:     Pharynx: No oropharyngeal exudate or posterior oropharyngeal erythema.  Eyes:     Conjunctiva/sclera: Conjunctivae normal.     Pupils: Pupils are equal, round, and reactive to light.  Cardiovascular:     Rate and Rhythm: Normal rate and regular rhythm.     Heart sounds: Normal heart sounds.  Pulmonary:     Effort: Pulmonary effort is normal. No respiratory  distress.     Breath sounds: Normal breath sounds. No wheezing.  Musculoskeletal:     Cervical back: Normal range of motion and neck supple.  Lymphadenopathy:     Cervical: No cervical adenopathy.  Skin:    General: Skin is warm and dry.  Neurological:     Mental Status: He is alert.           Assessment & Plan:  Chronic cough- improving since recent hospitalization.  Reviewed H&P, labs, imaging, D/C summary.  Now on Prednisone , Xyzal, Flonase .  ACE was switched to ARB.  Will refer to Allergy for complete testing.  If Allergy evaluation is unrevealing, will refer back to [facility] for pt to see a different provider.  Pt expressed understanding and is in agreement w/ plan.

## 2024-10-24 NOTE — Patient Instructions (Addendum)
 Follow up as needed or as scheduled We'll call you to schedule your allergy appt INCREASE the Xyzal to 1 tab daily CONTINUE the Flonase  I like the change in the blood pressure! Call with any questions or concerns Stay Safe!  Stay Healthy! Hang in there!

## 2024-11-05 ENCOUNTER — Encounter (INDEPENDENT_AMBULATORY_CARE_PROVIDER_SITE_OTHER): Admitting: Ophthalmology

## 2024-11-05 DIAGNOSIS — H353221 Exudative age-related macular degeneration, left eye, with active choroidal neovascularization: Secondary | ICD-10-CM

## 2024-11-05 DIAGNOSIS — I10 Essential (primary) hypertension: Secondary | ICD-10-CM | POA: Diagnosis not present

## 2024-11-05 DIAGNOSIS — H43813 Vitreous degeneration, bilateral: Secondary | ICD-10-CM

## 2024-11-05 DIAGNOSIS — H353112 Nonexudative age-related macular degeneration, right eye, intermediate dry stage: Secondary | ICD-10-CM | POA: Diagnosis not present

## 2024-11-05 DIAGNOSIS — H35033 Hypertensive retinopathy, bilateral: Secondary | ICD-10-CM

## 2024-11-06 ENCOUNTER — Telehealth: Payer: Self-pay | Admitting: Family Medicine

## 2024-11-06 DIAGNOSIS — R052 Subacute cough: Secondary | ICD-10-CM

## 2024-11-06 NOTE — Telephone Encounter (Signed)
 Copied from CRM [id number]. Topic: Clinical - Refused Triage >> Nov 06, 2024 11:24 AM Rea BROCKS wrote: Patient/caller voiced complaints of return of cough coming back and beginning to wheeze again. Patient stated that he is taking his OTC allergy meds and wants Dr. Mahlon to be aware but declined speaking with a nurse. He would like to know what would be his next steps. He is awaiting a call from the allergy specialist.He just wanted to pass along the message and stated he is going to call the allergy clinic since they didn't reach out. Declined transfer to triage.

## 2024-11-06 NOTE — Telephone Encounter (Signed)
 Agree that pt needs to call allergy office and see if he can get scheduled as that was the next step in the plan.  In the meantime, if cough and wheezing are worsening, we need to restart the Flovent  (inhaled steroid, currently on med list and can be resent if pt needs).  He also may need a chest xray if he is willing to have one (CXR 2 view at whichever location is most convenient for him- dx chronic cough)

## 2024-11-08 MED ORDER — FLUTICASONE PROPIONATE HFA 110 MCG/ACT IN AERO: 1.0000 | INHALATION_SPRAY | Freq: Two times a day (BID) | RESPIRATORY_TRACT | 1 refills | Status: AC

## 2024-11-08 NOTE — Telephone Encounter (Signed)
 Spoke w/ Pt- informed of recommendations. He is scheduled w/ allergy office on 12/02/24, Flovent  refilled. Pt declines chest x-ray at this time

## 2024-11-11 ENCOUNTER — Ambulatory Visit: Payer: Self-pay

## 2024-11-11 NOTE — Telephone Encounter (Signed)
 FYI Only or Action Required?: Action required by provider: request for appointment.  Patient was last seen in primary care on 10/24/2024 by Mahlon Comer BRAVO, MD.  Called Nurse Triage reporting No chief complaint on file..  Symptoms began several weeks ago.  Interventions attempted: Rest, hydration, or home remedies.  Symptoms are: gradually worsening.  Triage Disposition: No disposition on file.  Patient/caregiver understands and will follow disposition?:    Copied from CRM #[id number]. Topic: Clinical - Red Word Triage >> Nov 11, 2024  9:54 AM Suzen RAMAN wrote: Red Word that prompted transfer to Nurse Triage:  stop breathing yesterday night while sleeping for 7-8 sec per wife... previous symptoms have return(productive cough with mucous) after month was sent to [facility] then to the hospital a month ago with same symptoms. Patient was referred to allergist but unable to wait 4 weeks for appt.  Request recommendation on what to do ... Reason for Disposition  [1] MODERATE difficulty breathing (e.g., speaks in phrases, SOB even at rest, pulse 100-120) AND [2] still present when not coughing  Answer Assessment - Initial Assessment Questions Spoke with wife for Triage, reported patient had a coughing fit and a cessation of breathing for 7-8 seconds last night.   1. ONSET: When did the cough begin?      Several Weeks ago  2. SEVERITY: How bad is the cough today?      Constant  3. SPUTUM: Describe the color of your sputum (e.g., none, dry cough; clear, white, yellow, green)     None  4. HEMOPTYSIS: Are you coughing up any blood? If Yes, ask: How much? (e.g., flecks, streaks, tablespoons, etc.)     None  5. DIFFICULTY BREATHING: Are you having difficulty breathing? If Yes, ask: How bad is it? (e.g., mild, moderate, severe)      Yes, with coughing  6. FEVER: Do you have a fever? If Yes, ask: What is your temperature, how was it measured, and when did it start?      No  7. CARDIAC HISTORY: Do you have any history of heart disease? (e.g., heart attack, congestive heart failure)      HTN  8. LUNG HISTORY: Do you have any history of lung disease?  (e.g., pulmonary embolus, asthma, emphysema)     No 9. PE RISK FACTORS: Do you have a history of blood clots? (or: recent major surgery, recent prolonged travel, bedridden)     No  10. OTHER SYMPTOMS: Do you have any other symptoms? (e.g., runny nose, wheezing, chest pain)       Coughing Fits, Dizziness  12. TRAVEL: Have you traveled out of the country in the last month? (e.g., travel history, exposures)       No  Protocols used: Cough - Acute Non-Productive-A-AH

## 2024-11-11 NOTE — Telephone Encounter (Signed)
 Patient is in route to hospital for sx. Patient reported he cannot sleep or breath. FYI

## 2024-11-20 NOTE — Progress Notes (Signed)
 Jason Bartlett                                          MRN: [id number]   11/20/2024   The [facility] Quality Team Specialist reviewed this patient medical record for the purposes of chart review for care gap closure. The following were reviewed: abstraction for care gap closure-glycemic status assessment.    [facility] Quality Team

## 2024-12-02 ENCOUNTER — Ambulatory Visit: Admitting: Internal Medicine

## 2024-12-03 ENCOUNTER — Encounter (INDEPENDENT_AMBULATORY_CARE_PROVIDER_SITE_OTHER): Admitting: Ophthalmology

## 2024-12-03 DIAGNOSIS — H43813 Vitreous degeneration, bilateral: Secondary | ICD-10-CM

## 2024-12-03 DIAGNOSIS — H35033 Hypertensive retinopathy, bilateral: Secondary | ICD-10-CM | POA: Diagnosis not present

## 2024-12-03 DIAGNOSIS — H353221 Exudative age-related macular degeneration, left eye, with active choroidal neovascularization: Secondary | ICD-10-CM | POA: Diagnosis not present

## 2024-12-03 DIAGNOSIS — H353112 Nonexudative age-related macular degeneration, right eye, intermediate dry stage: Secondary | ICD-10-CM | POA: Diagnosis not present

## 2024-12-03 DIAGNOSIS — I10 Essential (primary) hypertension: Secondary | ICD-10-CM | POA: Diagnosis not present

## 2024-12-30 DIAGNOSIS — N529 Male erectile dysfunction, unspecified: Secondary | ICD-10-CM

## 2024-12-30 NOTE — Telephone Encounter (Signed)
 Copied from CRM [id number]. Topic: Clinical - Medication Refill >> Dec 30, 2024  1:24 PM Mercedes MATSU wrote: Medication:  tadalafil  (CIALIS ) 10 MG tablet   Has the patient contacted their pharmacy? Yes (Agent: If no, request that the patient contact the pharmacy for the refill. If patient does not wish to contact the pharmacy document the reason why and proceed with request.) (Agent: If yes, when and what did the pharmacy advise?)  This is the patient's preferred pharmacy:  [id number] - [place], [place] - [address] [place] [address] Phone: [phone] Fax: [phone]  Is this the correct pharmacy for this prescription? Yes If no, delete pharmacy and type the correct one.   Has the prescription been filled recently? Yes  Is the patient out of the medication? Yes  Has the patient been seen for an appointment in the last year OR does the patient have an upcoming appointment? Yes  Can we respond through MyChart? Yes  Agent: Please be advised that Rx refills may take up to 3 business days. We ask that you follow-up with your pharmacy.

## 2024-12-31 ENCOUNTER — Encounter (INDEPENDENT_AMBULATORY_CARE_PROVIDER_SITE_OTHER): Admitting: Ophthalmology

## 2024-12-31 DIAGNOSIS — H353112 Nonexudative age-related macular degeneration, right eye, intermediate dry stage: Secondary | ICD-10-CM | POA: Diagnosis not present

## 2024-12-31 DIAGNOSIS — H35033 Hypertensive retinopathy, bilateral: Secondary | ICD-10-CM | POA: Diagnosis not present

## 2024-12-31 DIAGNOSIS — H43813 Vitreous degeneration, bilateral: Secondary | ICD-10-CM | POA: Diagnosis not present

## 2024-12-31 DIAGNOSIS — I10 Essential (primary) hypertension: Secondary | ICD-10-CM

## 2024-12-31 DIAGNOSIS — H353221 Exudative age-related macular degeneration, left eye, with active choroidal neovascularization: Secondary | ICD-10-CM

## 2024-12-31 MED ORDER — TADALAFIL 10 MG PO TABS: 10.0000 mg | ORAL_TABLET | Freq: Every day | ORAL | 6 refills | Status: AC

## 2025-01-28 ENCOUNTER — Encounter (INDEPENDENT_AMBULATORY_CARE_PROVIDER_SITE_OTHER): Admitting: Ophthalmology

## 2025-01-28 DIAGNOSIS — I10 Essential (primary) hypertension: Secondary | ICD-10-CM | POA: Diagnosis not present

## 2025-01-28 DIAGNOSIS — H353231 Exudative age-related macular degeneration, bilateral, with active choroidal neovascularization: Secondary | ICD-10-CM

## 2025-01-28 DIAGNOSIS — H43813 Vitreous degeneration, bilateral: Secondary | ICD-10-CM

## 2025-01-28 DIAGNOSIS — H35033 Hypertensive retinopathy, bilateral: Secondary | ICD-10-CM

## 2025-01-29 ENCOUNTER — Other Ambulatory Visit: Payer: Self-pay | Admitting: Family Medicine

## 2025-02-25 ENCOUNTER — Encounter (INDEPENDENT_AMBULATORY_CARE_PROVIDER_SITE_OTHER): Admitting: Ophthalmology

## 2025-03-18 ENCOUNTER — Encounter: Admitting: Family Medicine

## 2025-09-30 ENCOUNTER — Ambulatory Visit
